# Patient Record
Sex: Female | Born: 1975 | Race: Black or African American | Hispanic: No | Marital: Single | Smoking: Never smoker
Health system: Southern US, Community
[De-identification: ages and names within clinical notes are randomized; demographics above are authoritative.]

## PROBLEM LIST (undated history)

## (undated) DIAGNOSIS — Z21 Asymptomatic human immunodeficiency virus [HIV] infection status: Secondary | ICD-10-CM

## (undated) DIAGNOSIS — D649 Anemia, unspecified: Secondary | ICD-10-CM

## (undated) DIAGNOSIS — F419 Anxiety disorder, unspecified: Secondary | ICD-10-CM

## (undated) DIAGNOSIS — B20 Human immunodeficiency virus [HIV] disease: Secondary | ICD-10-CM

## (undated) DIAGNOSIS — C541 Malignant neoplasm of endometrium: Secondary | ICD-10-CM

## (undated) HISTORY — DX: Asymptomatic human immunodeficiency virus (hiv) infection status: Z21

## (undated) HISTORY — DX: Human immunodeficiency virus (HIV) disease: B20

---

## 2003-10-11 ENCOUNTER — Emergency Department (HOSPITAL_COMMUNITY): Admission: EM | Admit: 2003-10-11 | Discharge: 2003-10-11 | Payer: Self-pay | Admitting: Emergency Medicine

## 2004-10-19 ENCOUNTER — Emergency Department (HOSPITAL_COMMUNITY): Admission: EM | Admit: 2004-10-19 | Discharge: 2004-10-19 | Payer: Self-pay | Admitting: Emergency Medicine

## 2005-01-10 ENCOUNTER — Ambulatory Visit: Payer: Self-pay | Admitting: Family Medicine

## 2005-01-19 ENCOUNTER — Ambulatory Visit: Payer: Self-pay | Admitting: Sports Medicine

## 2005-03-05 ENCOUNTER — Emergency Department (HOSPITAL_COMMUNITY): Admission: EM | Admit: 2005-03-05 | Discharge: 2005-03-05 | Payer: Self-pay | Admitting: Emergency Medicine

## 2005-03-22 ENCOUNTER — Emergency Department (HOSPITAL_COMMUNITY): Admission: EM | Admit: 2005-03-22 | Discharge: 2005-03-22 | Payer: Self-pay | Admitting: Emergency Medicine

## 2006-09-17 ENCOUNTER — Emergency Department (HOSPITAL_COMMUNITY): Admission: EM | Admit: 2006-09-17 | Discharge: 2006-09-17 | Payer: Self-pay | Admitting: Emergency Medicine

## 2006-11-14 ENCOUNTER — Emergency Department (HOSPITAL_COMMUNITY): Admission: EM | Admit: 2006-11-14 | Discharge: 2006-11-14 | Payer: Self-pay | Admitting: Emergency Medicine

## 2010-10-26 ENCOUNTER — Emergency Department (HOSPITAL_COMMUNITY)
Admission: EM | Admit: 2010-10-26 | Discharge: 2010-10-26 | Payer: Self-pay | Source: Home / Self Care | Admitting: Emergency Medicine

## 2010-11-15 ENCOUNTER — Encounter: Payer: Self-pay | Admitting: Adult Health

## 2010-11-15 ENCOUNTER — Other Ambulatory Visit: Payer: Self-pay | Admitting: Adult Health

## 2010-11-15 ENCOUNTER — Ambulatory Visit: Payer: Self-pay

## 2010-11-15 ENCOUNTER — Other Ambulatory Visit (INDEPENDENT_AMBULATORY_CARE_PROVIDER_SITE_OTHER): Payer: 59

## 2010-11-15 DIAGNOSIS — B20 Human immunodeficiency virus [HIV] disease: Secondary | ICD-10-CM | POA: Insufficient documentation

## 2010-11-15 LAB — CONVERTED CEMR LAB
ALT: 19 units/L (ref 0–35)
AST: 28 units/L (ref 0–37)
Albumin: 3.7 g/dL (ref 3.5–5.2)
Alkaline Phosphatase: 55 units/L (ref 39–117)
BUN: 7 mg/dL (ref 6–23)
Basophils Absolute: 0 10*3/uL (ref 0.0–0.1)
Basophils Relative: 0 % (ref 0–1)
CO2: 25 meq/L (ref 19–32)
Calcium: 8.9 mg/dL (ref 8.4–10.5)
Chloride: 106 meq/L (ref 96–112)
Cholesterol: 141 mg/dL (ref 0–200)
Creatinine, Ser: 0.51 mg/dL (ref 0.40–1.20)
Eosinophils Absolute: 0.5 10*3/uL (ref 0.0–0.7)
Eosinophils Relative: 10 % — ABNORMAL HIGH (ref 0–5)
Glucose, Bld: 84 mg/dL (ref 70–99)
HCT: 33.9 % — ABNORMAL LOW (ref 36.0–46.0)
HCV Ab: NEGATIVE
HDL: 39 mg/dL — ABNORMAL LOW (ref >39)
HIV 1 RNA Quant: 22800 copies/mL — ABNORMAL HIGH (ref <20)
HIV-1 RNA Quant, Log: 4.36 — ABNORMAL HIGH (ref <1.30)
HIV-1 antibody: POSITIVE — AB
HIV-2 Ab: NEGATIVE
HIV: REACTIVE
Hemoglobin: 10.9 g/dL — ABNORMAL LOW (ref 12.0–15.0)
Hep A Total Ab: NEGATIVE
Hep B Core Total Ab: NEGATIVE
Hep B S Ab: NEGATIVE
Hepatitis B Surface Ag: NEGATIVE
LDL Cholesterol: 80 mg/dL (ref 0–99)
Lymphocytes Relative: 31 % (ref 12–46)
Lymphs Abs: 1.4 10*3/uL (ref 0.7–4.0)
MCHC: 32.2 g/dL (ref 30.0–36.0)
MCV: 85.2 fL (ref 78.0–100.0)
Monocytes Absolute: 0.4 10*3/uL (ref 0.1–1.0)
Monocytes Relative: 9 % (ref 3–12)
Neutro Abs: 2.2 10*3/uL (ref 1.7–7.7)
Neutrophils Relative %: 50 % (ref 43–77)
Platelets: 231 10*3/uL (ref 150–400)
Potassium: 4.2 meq/L (ref 3.5–5.3)
RBC: 3.98 M/uL (ref 3.87–5.11)
RDW: 14.2 % (ref 11.5–15.5)
Sodium: 138 meq/L (ref 135–145)
Total Bilirubin: 0.4 mg/dL (ref 0.3–1.2)
Total CHOL/HDL Ratio: 3.6
Total Protein: 8.3 g/dL (ref 6.0–8.3)
Triglycerides: 111 mg/dL (ref <150)
VLDL: 22 mg/dL (ref 0–40)
WBC: 4.5 10*3/uL (ref 4.0–10.5)

## 2010-11-16 LAB — T-HELPER CELL (CD4) - (RCID CLINIC ONLY)
CD4 % Helper T Cell: 4 % — ABNORMAL LOW (ref 33–55)
CD4 T Cell Abs: 50 uL — ABNORMAL LOW (ref 400–2700)

## 2010-11-17 LAB — CONVERTED CEMR LAB
Bilirubin Urine: NEGATIVE
Blood, UA: NEGATIVE
Casts: NONE SEEN /lpf
Chlamydia, Swab/Urine, PCR: NEGATIVE
Crystals: NONE SEEN
GC Probe Amp, Urine: NEGATIVE
Ketones, ur: NEGATIVE mg/dL
Nitrite: POSITIVE — AB
Protein, ur: NEGATIVE mg/dL
Specific Gravity, Urine: 1.024 (ref 1.005–1.030)
Urine Glucose: NEGATIVE mg/dL
Urobilinogen, UA: 0.2 (ref 0.0–1.0)
pH: 6 (ref 5.0–8.0)

## 2010-11-20 ENCOUNTER — Emergency Department (HOSPITAL_COMMUNITY)
Admission: EM | Admit: 2010-11-20 | Discharge: 2010-11-20 | Disposition: A | Discharge status: Home / Self Care | Payer: 59 | Attending: Emergency Medicine | Admitting: Emergency Medicine

## 2010-11-20 DIAGNOSIS — F17200 Nicotine dependence, unspecified, uncomplicated: Secondary | ICD-10-CM | POA: Insufficient documentation

## 2010-11-20 DIAGNOSIS — H11419 Vascular abnormalities of conjunctiva, unspecified eye: Secondary | ICD-10-CM | POA: Insufficient documentation

## 2010-11-20 DIAGNOSIS — H5789 Other specified disorders of eye and adnexa: Secondary | ICD-10-CM | POA: Insufficient documentation

## 2010-11-20 DIAGNOSIS — H15009 Unspecified scleritis, unspecified eye: Secondary | ICD-10-CM | POA: Insufficient documentation

## 2010-11-20 DIAGNOSIS — Z21 Asymptomatic human immunodeficiency virus [HIV] infection status: Secondary | ICD-10-CM | POA: Insufficient documentation

## 2010-11-20 DIAGNOSIS — H53149 Visual discomfort, unspecified: Secondary | ICD-10-CM | POA: Insufficient documentation

## 2010-11-20 DIAGNOSIS — H5710 Ocular pain, unspecified eye: Secondary | ICD-10-CM | POA: Insufficient documentation

## 2010-11-24 NOTE — Miscellaneous (Signed)
Summary: LABS  Lab Visit  Orders Today:

## 2010-11-24 NOTE — Miscellaneous (Signed)
Summary: New 042 Lab orders/tkk  Clinical Lists Changes  Problems: Added new problem of HIV INFECTION (ICD-042) Orders: Added new Test order of T-Chlamydia  Probe, urine [phone]) - Signed Added new Test order of T-CBC w/Diff [phone]) - Signed Added new Test order of T-CD4SP [facility] [place]) (CD4SP) - Signed Added new Test order of T-GC Probe, urine [phone]) - Signed Added new Test order of T-Comprehensive Metabolic Panel [phone]) - Signed Added new Test order of T-Hepatitis B Surface Antigen [phone]) - Signed Added new Test order of T-Hepatitis B Surface Antibody ([phone]) - Signed Added new Test order of T-Hepatitis B Core Antibody ([phone]) - Signed Added new Test order of T-Hepatitis C Antibody ([id number]) - Signed Added new Test order of T-Hepatitis A Antibody ([id number]) - Signed Added new Test order of T-HIV1 Quant rflx Ultra or Genotype ([id number]) - Signed Added new Test order of T-HIV Antibody  (Reflex) ([id number]) - Signed Added new Test order of T-Lipid Profile ([id number]) - Signed Added new Test order of T-RPR (Syphilis) ([id number]) - Signed Added new Test order of T-Urinalysis ([id number]) - Signed

## 2010-11-29 ENCOUNTER — Ambulatory Visit: Payer: 59 | Admitting: Adult Health

## 2010-11-29 ENCOUNTER — Ambulatory Visit: Payer: Self-pay | Admitting: Adult Health

## 2010-12-13 ENCOUNTER — Ambulatory Visit: Payer: 59 | Admitting: Adult Health

## 2011-08-14 ENCOUNTER — Encounter: Payer: Self-pay | Admitting: Internal Medicine

## 2011-08-14 ENCOUNTER — Other Ambulatory Visit: Payer: Self-pay

## 2011-08-14 ENCOUNTER — Other Ambulatory Visit: Payer: Self-pay | Admitting: *Deleted

## 2011-08-14 DIAGNOSIS — Z113 Encounter for screening for infections with a predominantly sexual mode of transmission: Secondary | ICD-10-CM

## 2011-08-14 DIAGNOSIS — Z79899 Other long term (current) drug therapy: Secondary | ICD-10-CM

## 2011-08-14 DIAGNOSIS — B20 Human immunodeficiency virus [HIV] disease: Secondary | ICD-10-CM

## 2011-08-14 MED ORDER — SULFAMETHOXAZOLE-TMP DS 800-160 MG PO TABS: 1.0000 | ORAL_TABLET | Freq: Every day | ORAL | Status: DC

## 2011-08-14 MED ORDER — AZITHROMYCIN 600 MG PO TABS: 1200.0000 mg | ORAL_TABLET | ORAL | Status: DC

## 2011-08-15 ENCOUNTER — Other Ambulatory Visit: Payer: Self-pay | Admitting: Infectious Diseases

## 2011-08-15 ENCOUNTER — Other Ambulatory Visit (INDEPENDENT_AMBULATORY_CARE_PROVIDER_SITE_OTHER): Payer: Self-pay

## 2011-08-15 DIAGNOSIS — B20 Human immunodeficiency virus [HIV] disease: Secondary | ICD-10-CM

## 2011-08-15 DIAGNOSIS — Z113 Encounter for screening for infections with a predominantly sexual mode of transmission: Secondary | ICD-10-CM

## 2011-08-15 DIAGNOSIS — Z79899 Other long term (current) drug therapy: Secondary | ICD-10-CM

## 2011-08-16 LAB — URINALYSIS, MICROSCOPIC ONLY
Bacteria, UA: NONE SEEN
Casts: NONE SEEN
Crystals: NONE SEEN

## 2011-08-16 LAB — CBC WITH DIFFERENTIAL/PLATELET
Basophils Absolute: 0 10*3/uL (ref 0.0–0.1)
Basophils Relative: 1 % (ref 0–1)
Eosinophils Absolute: 0.2 10*3/uL (ref 0.0–0.7)
Eosinophils Relative: 7 % — ABNORMAL HIGH (ref 0–5)
HCT: 35 % — ABNORMAL LOW (ref 36.0–46.0)
Hemoglobin: 11.3 g/dL — ABNORMAL LOW (ref 12.0–15.0)
Lymphocytes Relative: 33 % (ref 12–46)
Lymphs Abs: 1.2 10*3/uL (ref 0.7–4.0)
MCH: 26.9 pg (ref 26.0–34.0)
MCHC: 32.3 g/dL (ref 30.0–36.0)
MCV: 83.3 fL (ref 78.0–100.0)
Monocytes Absolute: 0.4 10*3/uL (ref 0.1–1.0)
Monocytes Relative: 10 % (ref 3–12)
Neutro Abs: 1.9 10*3/uL (ref 1.7–7.7)
Neutrophils Relative %: 51 % (ref 43–77)
Platelets: 209 10*3/uL (ref 150–400)
RBC: 4.2 MIL/uL (ref 3.87–5.11)
RDW: 14.1 % (ref 11.5–15.5)
WBC: 3.7 10*3/uL — ABNORMAL LOW (ref 4.0–10.5)

## 2011-08-16 LAB — LIPID PANEL
Cholesterol: 144 mg/dL (ref 0–200)
HDL: 42 mg/dL (ref >39)
LDL Cholesterol: 88 mg/dL (ref 0–99)
Total CHOL/HDL Ratio: 3.4 Ratio
Triglycerides: 70 mg/dL (ref <150)
VLDL: 14 mg/dL (ref 0–40)

## 2011-08-16 LAB — COMPLETE METABOLIC PANEL WITH GFR
ALT: 28 U/L (ref 0–35)
AST: 34 U/L (ref 0–37)
Albumin: 4.3 g/dL (ref 3.5–5.2)
Alkaline Phosphatase: 60 U/L (ref 39–117)
BUN: 8 mg/dL (ref 6–23)
CO2: 24 mEq/L (ref 19–32)
Calcium: 8.9 mg/dL (ref 8.4–10.5)
Chloride: 104 mEq/L (ref 96–112)
Creat: 0.68 mg/dL (ref 0.50–1.10)
GFR, Est African American: >89 mL/min (ref >89)
GFR, Est Non African American: >89 mL/min (ref >89)
Glucose, Bld: 84 mg/dL (ref 70–99)
Potassium: 3.8 mEq/L (ref 3.5–5.3)
Sodium: 138 mEq/L (ref 135–145)
Total Bilirubin: 0.4 mg/dL (ref 0.3–1.2)
Total Protein: 8.7 g/dL — ABNORMAL HIGH (ref 6.0–8.3)

## 2011-08-16 LAB — URINALYSIS, ROUTINE W REFLEX MICROSCOPIC
Bilirubin Urine: NEGATIVE
Glucose, UA: NEGATIVE mg/dL
Ketones, ur: NEGATIVE mg/dL
Nitrite: NEGATIVE
Protein, ur: NEGATIVE mg/dL
Specific Gravity, Urine: 1.026 (ref 1.005–1.030)
Urobilinogen, UA: 1 mg/dL (ref 0.0–1.0)
pH: 6 (ref 5.0–8.0)

## 2011-08-16 LAB — GC/CHLAMYDIA PROBE AMP, URINE
Chlamydia, Swab/Urine, PCR: NEGATIVE
GC Probe Amp, Urine: NEGATIVE

## 2011-08-16 LAB — T-HELPER CELL (CD4) - (RCID CLINIC ONLY)
CD4 % Helper T Cell: 2 % — ABNORMAL LOW (ref 33–55)
CD4 T Cell Abs: 30 uL — ABNORMAL LOW (ref 400–2700)

## 2011-08-16 LAB — RPR

## 2011-08-17 LAB — HIV-1 RNA QUANT-NO REFLEX-BLD
HIV 1 RNA Quant: 40400 copies/mL — ABNORMAL HIGH (ref <20)
HIV-1 RNA Quant, Log: 4.61 {Log} — ABNORMAL HIGH (ref <1.30)

## 2011-08-29 ENCOUNTER — Ambulatory Visit: Payer: 59 | Admitting: Internal Medicine

## 2011-09-07 ENCOUNTER — Encounter: Payer: Self-pay | Admitting: *Deleted

## 2011-10-25 ENCOUNTER — Telehealth: Payer: Self-pay | Admitting: *Deleted

## 2011-10-25 NOTE — Telephone Encounter (Signed)
Called patient and scheduled appointment with Dr. Luciana Axe for 11/09/11.  She had labs done 08/15/11.  Speaking with [place], she does not seem to understand the importance of seeing a physician on a regular basis. Wendall Mola CMA

## 2011-11-09 ENCOUNTER — Ambulatory Visit (INDEPENDENT_AMBULATORY_CARE_PROVIDER_SITE_OTHER): Payer: Self-pay | Admitting: Internal Medicine

## 2011-11-09 ENCOUNTER — Encounter: Payer: Self-pay | Admitting: Internal Medicine

## 2011-11-09 VITALS — BP 126/82 | HR 89 | Temp 97.8°F | Wt 192.0 lb

## 2011-11-09 DIAGNOSIS — B20 Human immunodeficiency virus [HIV] disease: Secondary | ICD-10-CM

## 2011-11-09 DIAGNOSIS — Z23 Encounter for immunization: Secondary | ICD-10-CM

## 2011-11-09 DIAGNOSIS — N912 Amenorrhea, unspecified: Secondary | ICD-10-CM

## 2011-11-09 MED ORDER — RITONAVIR 100 MG PO TABS: 100.0000 mg | ORAL_TABLET | Freq: Every day | ORAL | Status: DC

## 2011-11-09 MED ORDER — EMTRICITABINE-TENOFOVIR DF 200-300 MG PO TABS: 1.0000 | ORAL_TABLET | Freq: Every day | ORAL | Status: DC

## 2011-11-09 MED ORDER — DARUNAVIR ETHANOLATE 800 MG PO TABS: 1.0000 | ORAL_TABLET | Freq: Every day | ORAL | Status: DC

## 2011-11-09 NOTE — Patient Instructions (Addendum)
Follow up with [facility] taking your medicine every day once you receive it  Check your blood test 3 weeks after start the medicine

## 2011-11-10 ENCOUNTER — Encounter: Payer: Self-pay | Admitting: Internal Medicine

## 2011-11-10 NOTE — Assessment & Plan Note (Signed)
After discussing treatment options, she will be started on boost it Prezista with Truvada. This will be initiated once she is eligible for ADAP. I discussed the side effects of the medications at length with her and answered all questions. I also did emphasize that she needs to continue with prophylaxis for PCP and MAC. She voiced her understanding. Once she fills her prescriptions, she will take the medication and was instructed to return for a lab test 3 weeks following that and she will be seen 1-2 weeks after the blood tests. The patient did voice her understanding of the need for excellent compliance. She denies being currently sexually active, however she was reminded to use condoms with all sexual activity.

## 2011-11-10 NOTE — Progress Notes (Signed)
  Subjective:    Patient ID: Kristy White, female    DOB: 27-Jun-1976, 36 y.o.   MRN: [id number]  HPI This patient comes in as a new patient for her HIV. The patient has known about her status for many years however has avoided treatment. She comes in today now interested in treatment. She is on weekly azithromycin as well as daily Bactrim prophylaxis. Her CD4 count was 30 and viral load elevated. She has had no recent complaints. She does express to me that she does not like to talk about her diagnosis and avoids thinking about it when possible however she does feel that she is ready and able to take medications. She does not like to talk about how she got it or any other aspects of HIV.   Review of Systems  Constitutional: Negative for fever, chills, appetite change, fatigue and unexpected weight change.  HENT: Negative for sore throat, mouth sores and trouble swallowing.   Respiratory: Negative for cough, shortness of breath and wheezing.   Cardiovascular: Negative for chest pain, palpitations and leg swelling.  Gastrointestinal: Negative for nausea, vomiting, abdominal pain and diarrhea.  Genitourinary: Positive for menstrual problem. Negative for dysuria, frequency, hematuria and genital sores.  Musculoskeletal: Negative for myalgias and arthralgias.  Skin: Negative for pallor and rash.  Neurological: Negative for dizziness, syncope, light-headedness, numbness and headaches.  Hematological: Negative for adenopathy.  Psychiatric/Behavioral: Negative for suicidal ideas, sleep disturbance and dysphoric mood. The patient is nervous/anxious.        Objective:   Physical Exam  Constitutional: She is oriented to person, place, and time. She appears well-developed and well-nourished. No distress.  HENT:  Mouth/Throat: Oropharynx is clear and moist. No oropharyngeal exudate.  Cardiovascular: Normal rate, regular rhythm and normal heart sounds.  Exam reveals no gallop and no friction rub.     No murmur heard. Pulmonary/Chest: Effort normal. No respiratory distress. She has no wheezes. She has no rales.  Abdominal: Soft. Bowel sounds are normal. She exhibits no distension. There is no tenderness. There is no rebound.  Lymphadenopathy:    She has no cervical adenopathy.  Neurological: She is alert and oriented to person, place, and time.  Skin: Skin is warm and dry. No rash noted. No erythema.  Psychiatric:       anxious          Assessment & Plan:

## 2011-11-21 ENCOUNTER — Ambulatory Visit: Payer: Self-pay

## 2011-11-21 ENCOUNTER — Telehealth: Payer: Self-pay | Admitting: *Deleted

## 2011-11-21 NOTE — Telephone Encounter (Signed)
She came in asking if she should get her HIV meds filled. She is waiting for ADAP. Has no insurance or money to get them filled. I told her as soon as we get notification that she has been approved, I will send them to [facility] in [place], [place]. They will notify her & ship them to her home. She is taking the bactrim & azithromycin.

## 2011-12-05 ENCOUNTER — Other Ambulatory Visit: Payer: Self-pay | Admitting: *Deleted

## 2011-12-05 DIAGNOSIS — B20 Human immunodeficiency virus [HIV] disease: Secondary | ICD-10-CM

## 2011-12-05 MED ORDER — RITONAVIR 100 MG PO TABS: 100.0000 mg | ORAL_TABLET | Freq: Every day | ORAL | Status: DC

## 2011-12-05 MED ORDER — AZITHROMYCIN 600 MG PO TABS: 1200.0000 mg | ORAL_TABLET | ORAL | Status: DC

## 2011-12-05 MED ORDER — DARUNAVIR ETHANOLATE 800 MG PO TABS: 800.0000 mg | ORAL_TABLET | Freq: Every day | ORAL | Status: DC

## 2011-12-05 MED ORDER — SULFAMETHOXAZOLE-TMP DS 800-160 MG PO TABS: 1.0000 | ORAL_TABLET | Freq: Every day | ORAL | Status: DC

## 2011-12-05 MED ORDER — EMTRICITABINE-TENOFOVIR DF 200-300 MG PO TABS: 1.0000 | ORAL_TABLET | Freq: Every day | ORAL | Status: DC

## 2011-12-07 ENCOUNTER — Other Ambulatory Visit (INDEPENDENT_AMBULATORY_CARE_PROVIDER_SITE_OTHER): Payer: Self-pay

## 2011-12-07 DIAGNOSIS — B20 Human immunodeficiency virus [HIV] disease: Secondary | ICD-10-CM

## 2011-12-07 LAB — HEPATITIS B SURFACE ANTIBODY,QUALITATIVE: Hep B S Ab: NEGATIVE

## 2011-12-07 LAB — CBC WITH DIFFERENTIAL/PLATELET
Basophils Absolute: 0 10*3/uL (ref 0.0–0.1)
Basophils Relative: 0 % (ref 0–1)
Eosinophils Absolute: 0.2 10*3/uL (ref 0.0–0.7)
Eosinophils Relative: 6 % — ABNORMAL HIGH (ref 0–5)
HCT: 30.5 % — ABNORMAL LOW (ref 36.0–46.0)
Hemoglobin: 10.1 g/dL — ABNORMAL LOW (ref 12.0–15.0)
Lymphocytes Relative: 31 % (ref 12–46)
Lymphs Abs: 1.1 10*3/uL (ref 0.7–4.0)
MCH: 27.9 pg (ref 26.0–34.0)
MCHC: 33.1 g/dL (ref 30.0–36.0)
MCV: 84.3 fL (ref 78.0–100.0)
Monocytes Absolute: 0.5 10*3/uL (ref 0.1–1.0)
Monocytes Relative: 14 % — ABNORMAL HIGH (ref 3–12)
Neutro Abs: 1.8 10*3/uL (ref 1.7–7.7)
Neutrophils Relative %: 49 % (ref 43–77)
Platelets: 206 10*3/uL (ref 150–400)
RBC: 3.62 MIL/uL — ABNORMAL LOW (ref 3.87–5.11)
RDW: 14 % (ref 11.5–15.5)
WBC: 3.7 10*3/uL — ABNORMAL LOW (ref 4.0–10.5)

## 2011-12-07 LAB — COMPREHENSIVE METABOLIC PANEL
ALT: 33 U/L (ref 0–35)
AST: 36 U/L (ref 0–37)
Albumin: 3.9 g/dL (ref 3.5–5.2)
Alkaline Phosphatase: 59 U/L (ref 39–117)
BUN: 7 mg/dL (ref 6–23)
CO2: 23 mEq/L (ref 19–32)
Calcium: 8.9 mg/dL (ref 8.4–10.5)
Chloride: 107 mEq/L (ref 96–112)
Creat: 0.59 mg/dL (ref 0.50–1.10)
Glucose, Bld: 76 mg/dL (ref 70–99)
Potassium: 3.8 mEq/L (ref 3.5–5.3)
Sodium: 137 mEq/L (ref 135–145)
Total Bilirubin: 0.5 mg/dL (ref 0.3–1.2)
Total Protein: 7.7 g/dL (ref 6.0–8.3)

## 2011-12-07 LAB — HEPATITIS B SURFACE ANTIGEN: Hepatitis B Surface Ag: NEGATIVE

## 2011-12-07 LAB — HEPATITIS C ANTIBODY: HCV Ab: NEGATIVE

## 2011-12-08 LAB — HEPATITIS B CORE ANTIBODY, TOTAL: Hep B Core Total Ab: NEGATIVE

## 2011-12-08 LAB — T-HELPER CELL (CD4) - (RCID CLINIC ONLY)
CD4 % Helper T Cell: 3 % — ABNORMAL LOW (ref 33–55)
CD4 T Cell Abs: 90 uL — ABNORMAL LOW (ref 400–2700)

## 2011-12-08 LAB — HEPATITIS A ANTIBODY, TOTAL: Hep A Total Ab: NEGATIVE

## 2011-12-11 ENCOUNTER — Other Ambulatory Visit: Payer: Self-pay | Admitting: *Deleted

## 2011-12-11 DIAGNOSIS — B20 Human immunodeficiency virus [HIV] disease: Secondary | ICD-10-CM

## 2011-12-11 LAB — HIV-1 RNA QUANT-NO REFLEX-BLD
HIV 1 RNA Quant: 41890 copies/mL — ABNORMAL HIGH (ref <20)
HIV-1 RNA Quant, Log: 4.62 {Log} — ABNORMAL HIGH (ref <1.30)

## 2011-12-11 MED ORDER — AZITHROMYCIN 600 MG PO TABS: 1200.0000 mg | ORAL_TABLET | ORAL | Status: DC

## 2011-12-11 MED ORDER — RITONAVIR 100 MG PO TABS: 100.0000 mg | ORAL_TABLET | Freq: Every day | ORAL | Status: DC

## 2011-12-11 MED ORDER — EMTRICITABINE-TENOFOVIR DF 200-300 MG PO TABS: 1.0000 | ORAL_TABLET | Freq: Every day | ORAL | Status: DC

## 2011-12-11 MED ORDER — SULFAMETHOXAZOLE-TMP DS 800-160 MG PO TABS: 1.0000 | ORAL_TABLET | Freq: Every day | ORAL | Status: DC

## 2011-12-11 MED ORDER — DARUNAVIR ETHANOLATE 800 MG PO TABS: 800.0000 mg | ORAL_TABLET | Freq: Every day | ORAL | Status: DC

## 2011-12-21 ENCOUNTER — Ambulatory Visit: Payer: Self-pay | Admitting: Internal Medicine

## 2011-12-21 ENCOUNTER — Telehealth: Payer: Self-pay | Admitting: *Deleted

## 2011-12-21 NOTE — Telephone Encounter (Signed)
I left a message for her to please call back & make another appt

## 2012-01-23 ENCOUNTER — Telehealth: Payer: Self-pay | Admitting: *Deleted

## 2012-01-23 ENCOUNTER — Ambulatory Visit: Payer: Self-pay | Admitting: Internal Medicine

## 2012-01-23 NOTE — Telephone Encounter (Signed)
Referral made to [facility] for multiple no shows. Sanyla Summey CMA  

## 2012-03-06 ENCOUNTER — Ambulatory Visit: Payer: Self-pay | Admitting: *Deleted

## 2012-03-06 ENCOUNTER — Telehealth: Payer: Self-pay | Admitting: *Deleted

## 2012-03-06 ENCOUNTER — Other Ambulatory Visit (INDEPENDENT_AMBULATORY_CARE_PROVIDER_SITE_OTHER): Payer: Self-pay

## 2012-03-06 ENCOUNTER — Ambulatory Visit (INDEPENDENT_AMBULATORY_CARE_PROVIDER_SITE_OTHER): Payer: Self-pay | Admitting: Internal Medicine

## 2012-03-06 ENCOUNTER — Ambulatory Visit: Payer: Self-pay | Admitting: Internal Medicine

## 2012-03-06 DIAGNOSIS — B20 Human immunodeficiency virus [HIV] disease: Secondary | ICD-10-CM

## 2012-03-06 DIAGNOSIS — R87619 Unspecified abnormal cytological findings in specimens from cervix uteri: Secondary | ICD-10-CM

## 2012-03-06 DIAGNOSIS — Z124 Encounter for screening for malignant neoplasm of cervix: Secondary | ICD-10-CM

## 2012-03-06 HISTORY — DX: Unspecified abnormal cytological findings in specimens from cervix uteri: R87.619

## 2012-03-06 NOTE — Progress Notes (Signed)
  Subjective:    Patient ID: Kristy White, female    DOB: [birth year], 36 y.o.   MRN: [id number]  HPI Patient arrived late.  Will get labs today.    Review of Systems     Objective:   Physical Exam        Assessment & Plan:

## 2012-03-06 NOTE — Progress Notes (Signed)
  Subjective:     Kristy White is a 36 y.o. woman who comes in today for a  pap smear only. Contraception: Condoms  Objective:    There were no vitals taken for this visit.  LMP 02/26/12 Pelvic Exam:  Pap smear obtained.   Assessment:    Screening pap smear.   Plan:    Follow up in one year, or as indicated by Pap results.  Pt given condoms.  Pt given educational materials re:  HIV and women, diet, exercise, nutrition, PAP smears, BSE, heart disease and HIV, and self-esteem.

## 2012-03-06 NOTE — Telephone Encounter (Signed)
Patient no showed todays appointment.  She was referred to [facility] in April for multiple no shows, will check with [facility] to see where they are with this referral. Wendall Mola CMA

## 2012-03-07 LAB — COMPLETE METABOLIC PANEL WITH GFR
ALT: 15 U/L (ref 0–35)
AST: 21 U/L (ref 0–37)
Albumin: 4 g/dL (ref 3.5–5.2)
Alkaline Phosphatase: 63 U/L (ref 39–117)
BUN: 10 mg/dL (ref 6–23)
CO2: 26 mEq/L (ref 19–32)
Calcium: 9 mg/dL (ref 8.4–10.5)
Chloride: 105 mEq/L (ref 96–112)
Creat: 0.8 mg/dL (ref 0.50–1.10)
GFR, Est African American: >89 mL/min
GFR, Est Non African American: >89 mL/min
Glucose, Bld: 92 mg/dL (ref 70–99)
Potassium: 4.3 mEq/L (ref 3.5–5.3)
Sodium: 137 mEq/L (ref 135–145)
Total Bilirubin: 0.2 mg/dL — ABNORMAL LOW (ref 0.3–1.2)
Total Protein: 8.6 g/dL — ABNORMAL HIGH (ref 6.0–8.3)

## 2012-03-07 LAB — CBC WITH DIFFERENTIAL/PLATELET
Basophils Absolute: 0 10*3/uL (ref 0.0–0.1)
Basophils Relative: 1 % (ref 0–1)
Eosinophils Absolute: 0.2 10*3/uL (ref 0.0–0.7)
Eosinophils Relative: 3 % (ref 0–5)
HCT: 32 % — ABNORMAL LOW (ref 36.0–46.0)
Hemoglobin: 10.8 g/dL — ABNORMAL LOW (ref 12.0–15.0)
Lymphocytes Relative: 45 % (ref 12–46)
Lymphs Abs: 2.4 10*3/uL (ref 0.7–4.0)
MCH: 29 pg (ref 26.0–34.0)
MCHC: 33.8 g/dL (ref 30.0–36.0)
MCV: 86 fL (ref 78.0–100.0)
Monocytes Absolute: 0.5 10*3/uL (ref 0.1–1.0)
Monocytes Relative: 9 % (ref 3–12)
Neutro Abs: 2.2 10*3/uL (ref 1.7–7.7)
Neutrophils Relative %: 42 % — ABNORMAL LOW (ref 43–77)
Platelets: 290 10*3/uL (ref 150–400)
RBC: 3.72 MIL/uL — ABNORMAL LOW (ref 3.87–5.11)
RDW: 14.8 % (ref 11.5–15.5)
WBC: 5.3 10*3/uL (ref 4.0–10.5)

## 2012-03-08 LAB — T-HELPER CELL (CD4) - (RCID CLINIC ONLY)
CD4 % Helper T Cell: 5 % — ABNORMAL LOW (ref 33–55)
CD4 T Cell Abs: 120 uL — ABNORMAL LOW (ref 400–2700)

## 2012-03-08 LAB — HIV-1 RNA QUANT-NO REFLEX-BLD
HIV 1 RNA Quant: 31 copies/mL — ABNORMAL HIGH (ref <20)
HIV-1 RNA Quant, Log: 1.49 {Log} — ABNORMAL HIGH (ref <1.30)

## 2012-03-19 ENCOUNTER — Telehealth: Payer: Self-pay | Admitting: *Deleted

## 2012-03-19 DIAGNOSIS — IMO0002 Reserved for concepts with insufficient information to code with codable children: Secondary | ICD-10-CM

## 2012-03-19 NOTE — Telephone Encounter (Signed)
Pt returned call re:  Referral to [facility] for abnormal PAP smear results.  RN advised that the pt will be contacted by either [facility] or Elisa E. From [facility] about the appt at [facility].  Pt concerned about the results.  RN advised pt that the GYN Clinic at [facility] will provide treatment for the abnormal results.  Also, that abnormal PAP smear results as the most treatable type of abnormal cells.  Pt verbalized understanding and will definitely be keeping this appt.

## 2012-03-19 NOTE — Telephone Encounter (Signed)
Message left.  Pt being referred to [facility] for LSIL PAP smear results.  Also, requested that pt call [facility] to reschedule appt w/ Dr. Luciana Axe.

## 2012-04-09 ENCOUNTER — Encounter: Payer: Self-pay | Admitting: Internal Medicine

## 2012-04-09 ENCOUNTER — Ambulatory Visit (INDEPENDENT_AMBULATORY_CARE_PROVIDER_SITE_OTHER): Payer: Self-pay

## 2012-04-09 VITALS — BP 132/84 | HR 98 | Temp 98.0°F | Ht 66.0 in | Wt 199.0 lb

## 2012-04-09 DIAGNOSIS — B20 Human immunodeficiency virus [HIV] disease: Secondary | ICD-10-CM

## 2012-04-09 NOTE — Progress Notes (Signed)
  Subjective:    Patient ID: Kristy White, female    DOB: [birth year], 36 y.o.   MRN: [id number]  HPI She comes in for followup of her HIV. She was seen in January after a long absence and never having been on treatment which she avoided mainly due to the stigma. She then though was ready for treatment and started on a regimen of boosted Prezista and Truvada. She reports good compliance though does endorse about 2 minutes the doses over the last month. She does understand the importance of compliance. She also continues to take the Bactrim and azithromycin prophylaxis. She otherwise has no complaints in regards to medication and is tolerating it well. She has had some recent problems with athlete's foot but that has resolved. She is interested in establishing a primary care physician.   Review of Systems  Constitutional: Negative for fever, appetite change, fatigue and unexpected weight change.  HENT: Negative for sore throat and trouble swallowing.   Respiratory: Negative for cough and shortness of breath.   Cardiovascular: Negative for chest pain, palpitations and leg swelling.  Gastrointestinal: Negative for nausea, abdominal pain and diarrhea.  Musculoskeletal: Negative for myalgias, joint swelling and arthralgias.  Skin: Negative for rash.  Neurological: Negative for dizziness, weakness and headaches.  Hematological: Negative for adenopathy.  Psychiatric/Behavioral: Negative for dysphoric mood. The patient is not nervous/anxious.        Objective:   Physical Exam  Constitutional: She appears well-developed and well-nourished. No distress.  HENT:  Mouth/Throat: Oropharynx is clear and moist. No oropharyngeal exudate.  Cardiovascular: Normal rate, regular rhythm and normal heart sounds.  Exam reveals no gallop and no friction rub.   No murmur heard. Pulmonary/Chest: Effort normal and breath sounds normal. No respiratory distress. She has no wheezes. She has no rales.  Abdominal:  Soft. Bowel sounds are normal. She exhibits no distension. There is no tenderness. There is no rebound.  Lymphadenopathy:    She has no cervical adenopathy.          Assessment & Plan:

## 2012-04-09 NOTE — Assessment & Plan Note (Signed)
She is doing well and appears to be accepting of her diagnosis. She understands the importance of taking the medication everyday and understands importance of condom use. She will return in 3 months for labs and a followup appointment 2 weeks after. She knows to call sooner if she has any problems. She is going to establish with a primary care physician.

## 2012-04-15 NOTE — Progress Notes (Signed)
This encounter was created in error - please disregard.

## 2012-04-18 ENCOUNTER — Encounter: Payer: Self-pay | Admitting: Physician Assistant

## 2012-05-14 ENCOUNTER — Ambulatory Visit: Payer: Self-pay

## 2012-05-27 ENCOUNTER — Telehealth: Payer: Self-pay | Admitting: *Deleted

## 2012-05-27 ENCOUNTER — Encounter: Payer: Self-pay | Admitting: Obstetrics & Gynecology

## 2012-05-27 NOTE — Telephone Encounter (Signed)
Patient was given appt at [facility], but after multiple voice mails were left with her and she did not return my call the appt was cancelled. Kristy White

## 2012-06-03 ENCOUNTER — Encounter: Payer: Self-pay | Admitting: Internal Medicine

## 2012-07-01 ENCOUNTER — Ambulatory Visit: Payer: Self-pay

## 2012-07-04 ENCOUNTER — Other Ambulatory Visit: Payer: Self-pay

## 2012-07-18 ENCOUNTER — Ambulatory Visit: Payer: Self-pay | Admitting: Internal Medicine

## 2012-09-19 ENCOUNTER — Other Ambulatory Visit: Payer: Self-pay | Admitting: *Deleted

## 2012-09-19 ENCOUNTER — Ambulatory Visit: Payer: Self-pay

## 2012-09-19 ENCOUNTER — Other Ambulatory Visit (INDEPENDENT_AMBULATORY_CARE_PROVIDER_SITE_OTHER): Payer: Self-pay

## 2012-09-19 DIAGNOSIS — B20 Human immunodeficiency virus [HIV] disease: Secondary | ICD-10-CM

## 2012-09-19 LAB — CBC WITH DIFFERENTIAL/PLATELET
Basophils Absolute: 0 10*3/uL (ref 0.0–0.1)
Basophils Relative: 0 % (ref 0–1)
Eosinophils Absolute: 0 10*3/uL (ref 0.0–0.7)
Eosinophils Relative: 1 % (ref 0–5)
HCT: 33.8 % — ABNORMAL LOW (ref 36.0–46.0)
Hemoglobin: 11.3 g/dL — ABNORMAL LOW (ref 12.0–15.0)
Lymphocytes Relative: 32 % (ref 12–46)
Lymphs Abs: 1.2 10*3/uL (ref 0.7–4.0)
MCH: 28.5 pg (ref 26.0–34.0)
MCHC: 33.4 g/dL (ref 30.0–36.0)
MCV: 85.4 fL (ref 78.0–100.0)
Monocytes Absolute: 0.5 10*3/uL (ref 0.1–1.0)
Monocytes Relative: 12 % (ref 3–12)
Neutro Abs: 2.1 10*3/uL (ref 1.7–7.7)
Neutrophils Relative %: 55 % (ref 43–77)
Platelets: 248 10*3/uL (ref 150–400)
RBC: 3.96 MIL/uL (ref 3.87–5.11)
RDW: 13.9 % (ref 11.5–15.5)
WBC: 3.9 10*3/uL — ABNORMAL LOW (ref 4.0–10.5)

## 2012-09-19 MED ORDER — DARUNAVIR ETHANOLATE 800 MG PO TABS: 800.0000 mg | ORAL_TABLET | Freq: Every day | ORAL | Status: DC

## 2012-09-19 MED ORDER — EMTRICITABINE-TENOFOVIR DF 200-300 MG PO TABS: 1.0000 | ORAL_TABLET | Freq: Every day | ORAL | Status: DC

## 2012-09-19 MED ORDER — RITONAVIR 100 MG PO TABS: 100.0000 mg | ORAL_TABLET | Freq: Every day | ORAL | Status: DC

## 2012-09-20 LAB — COMPLETE METABOLIC PANEL WITH GFR
ALT: 16 U/L (ref 0–35)
AST: 20 U/L (ref 0–37)
Albumin: 3.9 g/dL (ref 3.5–5.2)
Alkaline Phosphatase: 69 U/L (ref 39–117)
BUN: 7 mg/dL (ref 6–23)
CO2: 28 mEq/L (ref 19–32)
Calcium: 9.2 mg/dL (ref 8.4–10.5)
Chloride: 107 mEq/L (ref 96–112)
Creat: 0.57 mg/dL (ref 0.50–1.10)
GFR, Est African American: >89 mL/min
GFR, Est Non African American: >89 mL/min
Glucose, Bld: 85 mg/dL (ref 70–99)
Potassium: 3.9 mEq/L (ref 3.5–5.3)
Sodium: 139 mEq/L (ref 135–145)
Total Bilirubin: 0.2 mg/dL — ABNORMAL LOW (ref 0.3–1.2)
Total Protein: 8 g/dL (ref 6.0–8.3)

## 2012-09-20 LAB — HIV-1 RNA QUANT-NO REFLEX-BLD
HIV 1 RNA Quant: 38208 copies/mL — ABNORMAL HIGH (ref <20)
HIV-1 RNA Quant, Log: 4.58 {Log} — ABNORMAL HIGH (ref <1.30)

## 2012-09-20 LAB — T-HELPER CELL (CD4) - (RCID CLINIC ONLY)
CD4 % Helper T Cell: 9 % — ABNORMAL LOW (ref 33–55)
CD4 T Cell Abs: 110 uL — ABNORMAL LOW (ref 400–2700)

## 2012-09-25 ENCOUNTER — Ambulatory Visit: Payer: Self-pay | Admitting: Internal Medicine

## 2012-09-30 ENCOUNTER — Telehealth: Payer: Self-pay

## 2012-09-30 NOTE — Telephone Encounter (Signed)
Patient was approved for ADAP until 01/06/13 - ADAP has brithdate wrong - should be [birth year], not Feb 26, 1976 - tried to contact patient at both numbers in system - left vm on home phone to call me to let her know - faxed to Nobie Putnam at ADAP copy of id with correct birthdate - he will update in system and for [facility] as well.

## 2012-10-10 ENCOUNTER — Ambulatory Visit: Payer: Self-pay | Admitting: Internal Medicine

## 2012-10-10 ENCOUNTER — Telehealth: Payer: Self-pay | Admitting: *Deleted

## 2012-10-10 NOTE — Telephone Encounter (Signed)
Called patient to reschedule appt, she no showed today, no working phone numbers.  She did have labs 09/26/12. Wendall Mola

## 2012-12-26 ENCOUNTER — Telehealth: Payer: Self-pay | Admitting: *Deleted

## 2012-12-26 ENCOUNTER — Encounter: Payer: Self-pay | Admitting: *Deleted

## 2012-12-26 DIAGNOSIS — B20 Human immunodeficiency virus [HIV] disease: Secondary | ICD-10-CM

## 2012-12-26 MED ORDER — SULFAMETHOXAZOLE-TMP DS 800-160 MG PO TABS: 1.0000 | ORAL_TABLET | Freq: Every day | ORAL | Status: DC

## 2012-12-26 MED ORDER — EMTRICITABINE-TENOFOVIR DF 200-300 MG PO TABS: 1.0000 | ORAL_TABLET | Freq: Every day | ORAL | Status: DC

## 2012-12-26 MED ORDER — DARUNAVIR ETHANOLATE 800 MG PO TABS: 800.0000 mg | ORAL_TABLET | Freq: Every day | ORAL | Status: DC

## 2012-12-26 MED ORDER — AZITHROMYCIN 600 MG PO TABS: 1200.0000 mg | ORAL_TABLET | ORAL | Status: DC

## 2012-12-26 MED ORDER — RITONAVIR 100 MG PO TABS: 100.0000 mg | ORAL_TABLET | Freq: Every day | ORAL | Status: DC

## 2012-12-26 NOTE — Telephone Encounter (Signed)
Sent pt letter requesting she make MD, Lab and ADAP renewal appt ASAP.

## 2013-01-02 ENCOUNTER — Other Ambulatory Visit: Payer: Self-pay | Admitting: *Deleted

## 2013-01-02 DIAGNOSIS — B20 Human immunodeficiency virus [HIV] disease: Secondary | ICD-10-CM

## 2013-01-02 DIAGNOSIS — Z79899 Other long term (current) drug therapy: Secondary | ICD-10-CM

## 2013-01-02 DIAGNOSIS — Z113 Encounter for screening for infections with a predominantly sexual mode of transmission: Secondary | ICD-10-CM

## 2013-01-06 ENCOUNTER — Other Ambulatory Visit (INDEPENDENT_AMBULATORY_CARE_PROVIDER_SITE_OTHER): Payer: Self-pay

## 2013-01-06 ENCOUNTER — Ambulatory Visit: Payer: Self-pay

## 2013-01-06 ENCOUNTER — Other Ambulatory Visit (HOSPITAL_COMMUNITY): Admission: RE | Admit: 2013-01-06 | Payer: Self-pay | Source: Ambulatory Visit | Admitting: Infectious Disease

## 2013-01-06 DIAGNOSIS — Z113 Encounter for screening for infections with a predominantly sexual mode of transmission: Secondary | ICD-10-CM

## 2013-01-06 DIAGNOSIS — Z79899 Other long term (current) drug therapy: Secondary | ICD-10-CM

## 2013-01-06 DIAGNOSIS — B20 Human immunodeficiency virus [HIV] disease: Secondary | ICD-10-CM

## 2013-01-06 LAB — COMPLETE METABOLIC PANEL WITH GFR
ALT: 13 U/L (ref 0–35)
AST: 15 U/L (ref 0–37)
Albumin: 3.6 g/dL (ref 3.5–5.2)
Alkaline Phosphatase: 74 U/L (ref 39–117)
BUN: 9 mg/dL (ref 6–23)
CO2: 28 mEq/L (ref 19–32)
Calcium: 8.9 mg/dL (ref 8.4–10.5)
Chloride: 104 mEq/L (ref 96–112)
Creat: 0.48 mg/dL — ABNORMAL LOW (ref 0.50–1.10)
GFR, Est African American: >89 mL/min
GFR, Est Non African American: >89 mL/min
Glucose, Bld: 71 mg/dL (ref 70–99)
Potassium: 3.9 mEq/L (ref 3.5–5.3)
Sodium: 137 mEq/L (ref 135–145)
Total Bilirubin: 0.3 mg/dL (ref 0.3–1.2)
Total Protein: 7.7 g/dL (ref 6.0–8.3)

## 2013-01-06 LAB — LIPID PANEL
Cholesterol: 147 mg/dL (ref 0–200)
HDL: 38 mg/dL — ABNORMAL LOW (ref >39)
LDL Cholesterol: 87 mg/dL (ref 0–99)
Total CHOL/HDL Ratio: 3.9 Ratio
Triglycerides: 108 mg/dL (ref <150)
VLDL: 22 mg/dL (ref 0–40)

## 2013-01-06 LAB — CBC WITH DIFFERENTIAL/PLATELET
Basophils Absolute: 0 10*3/uL (ref 0.0–0.1)
Basophils Relative: 1 % (ref 0–1)
Eosinophils Absolute: 0.1 10*3/uL (ref 0.0–0.7)
Eosinophils Relative: 2 % (ref 0–5)
HCT: 33.3 % — ABNORMAL LOW (ref 36.0–46.0)
Hemoglobin: 10.9 g/dL — ABNORMAL LOW (ref 12.0–15.0)
Lymphocytes Relative: 41 % (ref 12–46)
Lymphs Abs: 2.6 10*3/uL (ref 0.7–4.0)
MCH: 28.2 pg (ref 26.0–34.0)
MCHC: 32.7 g/dL (ref 30.0–36.0)
MCV: 86.3 fL (ref 78.0–100.0)
Monocytes Absolute: 0.5 10*3/uL (ref 0.1–1.0)
Monocytes Relative: 8 % (ref 3–12)
Neutro Abs: 3.2 10*3/uL (ref 1.7–7.7)
Neutrophils Relative %: 48 % (ref 43–77)
Platelets: 326 10*3/uL (ref 150–400)
RBC: 3.86 MIL/uL — ABNORMAL LOW (ref 3.87–5.11)
RDW: 14.4 % (ref 11.5–15.5)
WBC: 6.5 10*3/uL (ref 4.0–10.5)

## 2013-01-07 LAB — T-HELPER CELL (CD4) - (RCID CLINIC ONLY)
CD4 % Helper T Cell: 8 % — ABNORMAL LOW (ref 33–55)
CD4 T Cell Abs: 190 uL — ABNORMAL LOW (ref 400–2700)

## 2013-01-07 LAB — RPR

## 2013-01-08 LAB — HIV-1 RNA QUANT-NO REFLEX-BLD
HIV 1 RNA Quant: <20 copies/mL (ref <20)
HIV-1 RNA Quant, Log: <1.3 {Log} (ref <1.30)

## 2013-01-20 ENCOUNTER — Ambulatory Visit (INDEPENDENT_AMBULATORY_CARE_PROVIDER_SITE_OTHER): Payer: Self-pay | Admitting: Internal Medicine

## 2013-01-20 ENCOUNTER — Encounter: Payer: Self-pay | Admitting: Internal Medicine

## 2013-01-20 VITALS — BP 127/85 | HR 84 | Temp 98.4°F | Ht 68.0 in | Wt 216.0 lb

## 2013-01-20 DIAGNOSIS — R6889 Other general symptoms and signs: Secondary | ICD-10-CM

## 2013-01-20 DIAGNOSIS — IMO0002 Reserved for concepts with insufficient information to code with codable children: Secondary | ICD-10-CM

## 2013-01-20 DIAGNOSIS — B20 Human immunodeficiency virus [HIV] disease: Secondary | ICD-10-CM

## 2013-01-20 MED ORDER — DARUNAVIR ETHANOLATE 800 MG PO TABS: 800.0000 mg | ORAL_TABLET | Freq: Every day | ORAL | Status: DC

## 2013-01-20 MED ORDER — RITONAVIR 100 MG PO TABS: 100.0000 mg | ORAL_TABLET | Freq: Every day | ORAL | Status: DC

## 2013-01-20 MED ORDER — SULFAMETHOXAZOLE-TMP DS 800-160 MG PO TABS: 1.0000 | ORAL_TABLET | Freq: Every day | ORAL | Status: DC

## 2013-01-20 MED ORDER — EMTRICITABINE-TENOFOVIR DF 200-300 MG PO TABS: 1.0000 | ORAL_TABLET | Freq: Every day | ORAL | Status: DC

## 2013-01-20 NOTE — Assessment & Plan Note (Signed)
She will be referred back to [facility] again the

## 2013-01-20 NOTE — Assessment & Plan Note (Addendum)
She is doing well now with her regimen now that she is started. I'm going to have her return in 2 months.  She does not need DAs azithromycin at this time but will continue Bactrim prophylaxis.  She was counseled on the need for compliance and good followup to maintain her health.

## 2013-01-20 NOTE — Progress Notes (Signed)
  Subjective:    Patient ID: Kristy White, female    DOB: 09/12/1976, 37 y.o.   MRN: [id number]  HPI She comes in for followup of her HIV. She has had sporadic followup and I last saw her in April of 2013 and she had labs done after that but no showed the appointment, but she comes in here now having filled her prescriptions recently for her antiretrovirals which include Prezista, Norvir and Truvada, and she actually has an undetectable viral load with good CD4 count. She feels well on the medication with no side effects and is pleased to be undetectable. She has no other new issues.    He does have a history of an abnormal Pap smear and did have an appointment at [facility] clinic however did not make the appointment, this was last year.   Review of Systems  Constitutional: Negative for fever, fatigue and unexpected weight change.  HENT: Negative for sore throat and trouble swallowing.   Respiratory: Negative for cough and shortness of breath.   Cardiovascular: Negative for leg swelling.  Gastrointestinal: Negative for nausea, abdominal pain and diarrhea.  Musculoskeletal: Negative for myalgias, joint swelling and arthralgias.  Skin: Negative for rash.  Neurological: Negative for dizziness, light-headedness and headaches.       Objective:   Physical Exam  Constitutional: She appears well-developed and well-nourished. No distress.  HENT:  Mouth/Throat: Oropharynx is clear and moist. No oropharyngeal exudate.  Cardiovascular: Normal rate, regular rhythm and normal heart sounds.  Exam reveals no gallop and no friction rub.   No murmur heard. Pulmonary/Chest: Effort normal and breath sounds normal. No respiratory distress. She has no wheezes. She has no rales.  Abdominal: Soft. Bowel sounds are normal. She exhibits no distension. There is no tenderness. There is no rebound.  Skin: No rash noted.          Assessment & Plan:

## 2013-01-27 ENCOUNTER — Ambulatory Visit: Payer: Self-pay | Admitting: Internal Medicine

## 2013-03-17 ENCOUNTER — Other Ambulatory Visit: Payer: Self-pay

## 2013-03-31 ENCOUNTER — Ambulatory Visit (INDEPENDENT_AMBULATORY_CARE_PROVIDER_SITE_OTHER): Payer: Self-pay | Admitting: Internal Medicine

## 2013-03-31 ENCOUNTER — Encounter: Payer: Self-pay | Admitting: Internal Medicine

## 2013-03-31 VITALS — BP 127/81 | HR 86 | Temp 98.3°F | Ht 66.0 in | Wt 218.0 lb

## 2013-03-31 DIAGNOSIS — B20 Human immunodeficiency virus [HIV] disease: Secondary | ICD-10-CM

## 2013-03-31 NOTE — Assessment & Plan Note (Signed)
I did discuss resistance with air and hopefully she is doing well. I will check her labs today. She will come back in 2 months if they are reassuring otherwise I will call her back sooner. She has missed some of her doses of Bactrim and I did discuss with her opportunistic infections.

## 2013-03-31 NOTE — Progress Notes (Signed)
  Subjective:    Patient ID: Kristy White, female    DOB: 01/11/1976, 37 y.o.   MRN: [id number]  HPI She comes in for a followup. After a sporadic followup and poor compliance in the past, she has been started on Prezista, Norvir and Truvada.  Her CD4 count and viral load were responding well with a CD4 of 190 and suppression of her viral load. She does continue to take the medicine though does endorse several missed doses since her last visit. She feels well though but does have multiple questions regarding resistance. No weight loss, no diarrhea or rashes.   Review of Systems  Constitutional: Negative for fever and fatigue.  HENT: Negative for sore throat and trouble swallowing.   Respiratory: Negative for shortness of breath.   Gastrointestinal: Negative for nausea and diarrhea.  Musculoskeletal: Negative for myalgias and arthralgias.  Skin: Negative for rash.  Neurological: Negative for dizziness, light-headedness and headaches.  Hematological: Negative for adenopathy.  Psychiatric/Behavioral: Negative for dysphoric mood.       Objective:   Physical Exam  Constitutional: She appears well-developed and well-nourished. No distress.  HENT:  Mouth/Throat: No oropharyngeal exudate.  Eyes: No scleral icterus.  Cardiovascular: Normal rate, regular rhythm and normal heart sounds.   No murmur heard. Pulmonary/Chest: Effort normal and breath sounds normal. No respiratory distress. She has no wheezes.  Lymphadenopathy:    She has no cervical adenopathy.          Assessment & Plan:

## 2013-04-02 LAB — T-HELPER CELL (CD4) - (RCID CLINIC ONLY)
CD4 % Helper T Cell: 10 % — ABNORMAL LOW (ref 33–55)
CD4 T Cell Abs: 220 uL — ABNORMAL LOW (ref 400–2700)

## 2013-04-02 LAB — HIV-1 RNA QUANT-NO REFLEX-BLD
HIV 1 RNA Quant: <20 copies/mL (ref <20)
HIV-1 RNA Quant, Log: <1.3 {Log} (ref <1.30)

## 2013-04-15 ENCOUNTER — Other Ambulatory Visit: Payer: Self-pay | Admitting: Internal Medicine

## 2013-04-15 DIAGNOSIS — IMO0002 Reserved for concepts with insufficient information to code with codable children: Secondary | ICD-10-CM

## 2013-04-15 DIAGNOSIS — R87612 Low grade squamous intraepithelial lesion on cytologic smear of cervix (LGSIL): Secondary | ICD-10-CM

## 2013-05-16 ENCOUNTER — Encounter: Payer: Self-pay | Admitting: Obstetrics & Gynecology

## 2013-05-16 ENCOUNTER — Other Ambulatory Visit (HOSPITAL_COMMUNITY)
Admission: RE | Admit: 2013-05-16 | Discharge: 2013-05-16 | Disposition: A | Discharge status: Home / Self Care | Payer: Medicaid Other | Source: Ambulatory Visit | Attending: Obstetrics & Gynecology | Admitting: Obstetrics & Gynecology

## 2013-05-16 ENCOUNTER — Ambulatory Visit (INDEPENDENT_AMBULATORY_CARE_PROVIDER_SITE_OTHER): Payer: Medicaid Other | Admitting: Obstetrics & Gynecology

## 2013-05-16 VITALS — BP 116/78 | HR 72 | Temp 97.1°F | Ht 66.0 in | Wt 216.0 lb

## 2013-05-16 DIAGNOSIS — R6889 Other general symptoms and signs: Secondary | ICD-10-CM

## 2013-05-16 DIAGNOSIS — IMO0002 Reserved for concepts with insufficient information to code with codable children: Secondary | ICD-10-CM

## 2013-05-16 DIAGNOSIS — R87612 Low grade squamous intraepithelial lesion on cytologic smear of cervix (LGSIL): Secondary | ICD-10-CM

## 2013-05-16 DIAGNOSIS — Z1151 Encounter for screening for human papillomavirus (HPV): Secondary | ICD-10-CM | POA: Insufficient documentation

## 2013-05-16 DIAGNOSIS — Z01419 Encounter for gynecological examination (general) (routine) without abnormal findings: Secondary | ICD-10-CM | POA: Insufficient documentation

## 2013-05-16 NOTE — Progress Notes (Signed)
Patient ID: Kristy White, female   DOB: [birth year], 37 y.o.   MRN: [id number]  Chief Complaint  Patient presents with  . Abnormal Pap Smear    repeat pap    HPI Kristy White is a 37 y.o. female.  [id number] Patient's last menstrual period was 03/30/2013. No t currently sexually active, had pap 03/06/12 LSIL, missed appointment for colposcopy.  HPI  Past Medical History  Diagnosis Date  . HIV infection     History reviewed. No pertinent past surgical history.  Family History  Problem Relation Age of Onset  . Mental illness Mother     Social History History  Substance Use Topics  . Smoking status: Never Smoker   . Smokeless tobacco: Never Used  . Alcohol Use: No    No Known Allergies  Current Outpatient Prescriptions  Medication Sig Dispense Refill  . Darunavir Ethanolate (PREZISTA) 800 MG tablet Take 1 tablet (800 mg total) by mouth daily. Close azithromycin  30 tablet  5  . emtricitabine-tenofovir (TRUVADA) 200-300 MG per tablet Take 1 tablet by mouth daily.  30 tablet  5  . ritonavir (NORVIR) 100 MG TABS Take 1 tablet (100 mg total) by mouth daily. With Prezista.  30 tablet  5  . sulfamethoxazole-trimethoprim (BACTRIM DS) 800-160 MG per tablet Take 1 tablet by mouth daily.  30 tablet  5   No current facility-administered medications for this visit.    Review of Systems Review of Systems  Genitourinary: Positive for menstrual problem (irregular). Negative for vaginal bleeding, vaginal discharge and pelvic pain.    Blood pressure 116/78, pulse 72, temperature 97.1 F (36.2 C), temperature source Oral, height 5\' 6"  (1.676 m), weight 216 lb (97.977 kg), last menstrual period 03/30/2013.  Physical Exam Physical Exam  Constitutional: She appears well-developed. No distress.  Pulmonary/Chest: Effort normal. No respiratory distress.  Genitourinary: Vagina normal. No vaginal discharge found.  Pap done, cervix nulliparous  Skin: Skin is warm and dry.  Psychiatric:  She has a normal mood and affect. Her behavior is normal.    Data Reviewed Pap result  Assessment    H/O LSIL pap 15 months ago     Plan    Pap repeated + HPV cotesting, if normal then repeat 1 year        Kristy White 05/16/2013, 8:44 AM

## 2013-05-16 NOTE — Patient Instructions (Signed)
Abnormal Pap Test Information During a Pap test, the cells on the surface of your cervix are checked to see if they look normal, abnormal, or if they show signs of having been altered by a certain type of virus called human papillomavirus, or HPV. Cervical cells that have been affected by HPV are called dysplasia. Dysplasia is not cancer, but describes abnormal cells found on the surface of the cervix. Depending on the degree of dysplasia, some of the cells may be considered pre-cancerous and may turn into cancer over time if follow up with a caregiver is delayed.  WHAT DOES AN ABNORMAL PAP TEST MEAN? Having an abnormal pap test does not mean that you have cancer. However, certain types of abnormal pap tests can be a sign that a person is at a higher risk of developing cancer. Your caregiver will want to do other tests to find out more about the abnormal cells. Your abnormal Pap test results could show:   Small and uncertain changes that should be carefully watched.   Cervical dysplasia that has caused mild changes and can be followed over time.  Cervical dysplasia that is more severe and needs to be followed and treated to ensure the problem goes away.  Cancer.  When severe cervical dysplasia is found and treated early, it rarely will grow into cancer.  WHAT WILL BE DONE ABOUT MY ABNORMAL PAP TEST?  A colposcopy may be needed. This is a procedure where your cervix is examined using light and magnification.  A small tissue sample of your cervix (biopsy) may need to be removed and then examined. This is often performed if there are areas that appear infected.  A sample of cells from the cervical canal may be removed with either a small brush or scraping instrument (curette). Based on the results of the procedures above, some caregivers may recommend either cryotherapy of the cervix or a surgical LEEP where a portion of the cervix is removed. LEEP is short for "loop electrical excisional  procedure." Rarely, a caregiver may recommend a cone biopsy.This is a procedure where a small, cone-shaped sample of your cervix is taken out. The part that is taken out is the area where the abnormal cells are.  WHAT IF I HAVE A DYSPLASIA OR A CANCER? You may be referred to a specialist. Radiation may also be a treatment for more advanced cancer. Having a hysterectomy is the last treatment option for dysplasia, but it is a more common treatment for someone with cancer. All treatment options will be discussed with you by your caregiver. WHAT SHOULD YOU DO AFTER BEING TREATED? If you have had an abnormal pap test, you should continue to have regular pap tests and check-ups as directed by your caregiver. Your cervical problem will be carefully watched so it does not get worse. Also, your caregiver can watch for, and treat, any new problems that may come up. Document Released: 01/10/2011 Document Revised: 12/18/2011 Document Reviewed: 09/21/2011 Exit[employer] Patient Information 2014 [employer].  

## 2013-05-29 ENCOUNTER — Other Ambulatory Visit: Payer: Self-pay

## 2013-06-12 ENCOUNTER — Telehealth: Payer: Self-pay | Admitting: *Deleted

## 2013-06-12 ENCOUNTER — Ambulatory Visit: Payer: Self-pay | Admitting: Internal Medicine

## 2013-06-12 NOTE — Telephone Encounter (Signed)
Called patient and left her a brief voice mail explaining our no show policy. Advised she can call the clinic and speak to anyone hear who would be happy to explain further the policy. Wendall Mola

## 2013-06-24 ENCOUNTER — Telehealth: Payer: Self-pay | Admitting: *Deleted

## 2013-06-24 NOTE — Telephone Encounter (Signed)
Returned patient's call requesting information about our walk-in policy.  Gave her information including days, times, and that while she may not be able to see a MD, we would try to at least get labs and a nurse visit. Andree Coss, RN

## 2014-01-24 ENCOUNTER — Other Ambulatory Visit: Payer: Self-pay | Admitting: Internal Medicine

## 2014-01-24 DIAGNOSIS — B20 Human immunodeficiency virus [HIV] disease: Secondary | ICD-10-CM

## 2014-01-26 ENCOUNTER — Encounter: Payer: Self-pay | Admitting: Internal Medicine

## 2014-01-26 ENCOUNTER — Other Ambulatory Visit (HOSPITAL_COMMUNITY)
Admission: RE | Admit: 2014-01-26 | Discharge: 2014-01-26 | Disposition: A | Discharge status: Home / Self Care | Payer: Medicaid Other | Source: Ambulatory Visit | Attending: Internal Medicine | Admitting: Internal Medicine

## 2014-01-26 ENCOUNTER — Telehealth: Payer: Self-pay | Admitting: *Deleted

## 2014-01-26 ENCOUNTER — Ambulatory Visit (INDEPENDENT_AMBULATORY_CARE_PROVIDER_SITE_OTHER): Payer: Medicaid Other | Admitting: Internal Medicine

## 2014-01-26 VITALS — BP 121/78 | HR 86 | Temp 97.4°F | Wt 224.0 lb

## 2014-01-26 DIAGNOSIS — R635 Abnormal weight gain: Secondary | ICD-10-CM | POA: Insufficient documentation

## 2014-01-26 DIAGNOSIS — N926 Irregular menstruation, unspecified: Secondary | ICD-10-CM

## 2014-01-26 DIAGNOSIS — Z113 Encounter for screening for infections with a predominantly sexual mode of transmission: Secondary | ICD-10-CM

## 2014-01-26 DIAGNOSIS — Z79899 Other long term (current) drug therapy: Secondary | ICD-10-CM

## 2014-01-26 DIAGNOSIS — N939 Abnormal uterine and vaginal bleeding, unspecified: Secondary | ICD-10-CM | POA: Insufficient documentation

## 2014-01-26 DIAGNOSIS — B20 Human immunodeficiency virus [HIV] disease: Secondary | ICD-10-CM

## 2014-01-26 LAB — COMPLETE METABOLIC PANEL WITH GFR
ALT: 15 U/L (ref 0–35)
AST: 18 U/L (ref 0–37)
Albumin: 3.9 g/dL (ref 3.5–5.2)
Alkaline Phosphatase: 77 U/L (ref 39–117)
BUN: 7 mg/dL (ref 6–23)
CO2: 24 mEq/L (ref 19–32)
Calcium: 8.9 mg/dL (ref 8.4–10.5)
Chloride: 105 mEq/L (ref 96–112)
Creat: 0.56 mg/dL (ref 0.50–1.10)
GFR, Est African American: >89 mL/min
GFR, Est Non African American: >89 mL/min
Glucose, Bld: 96 mg/dL (ref 70–99)
Potassium: 4.1 mEq/L (ref 3.5–5.3)
Sodium: 139 mEq/L (ref 135–145)
Total Bilirubin: 0.3 mg/dL (ref 0.2–1.2)
Total Protein: 7.5 g/dL (ref 6.0–8.3)

## 2014-01-26 LAB — CBC WITH DIFFERENTIAL/PLATELET
Basophils Absolute: 0 10*3/uL (ref 0.0–0.1)
Basophils Relative: 0 % (ref 0–1)
Eosinophils Absolute: 0.1 10*3/uL (ref 0.0–0.7)
Eosinophils Relative: 1 % (ref 0–5)
HCT: 31.9 % — ABNORMAL LOW (ref 36.0–46.0)
Hemoglobin: 10.9 g/dL — ABNORMAL LOW (ref 12.0–15.0)
Lymphocytes Relative: 30 % (ref 12–46)
Lymphs Abs: 2 10*3/uL (ref 0.7–4.0)
MCH: 28.8 pg (ref 26.0–34.0)
MCHC: 34.2 g/dL (ref 30.0–36.0)
MCV: 84.2 fL (ref 78.0–100.0)
Monocytes Absolute: 0.7 10*3/uL (ref 0.1–1.0)
Monocytes Relative: 10 % (ref 3–12)
Neutro Abs: 3.9 10*3/uL (ref 1.7–7.7)
Neutrophils Relative %: 59 % (ref 43–77)
Platelets: 344 10*3/uL (ref 150–400)
RBC: 3.79 MIL/uL — ABNORMAL LOW (ref 3.87–5.11)
RDW: 14.6 % (ref 11.5–15.5)
WBC: 6.6 10*3/uL (ref 4.0–10.5)

## 2014-01-26 LAB — LIPID PANEL
Cholesterol: 172 mg/dL (ref 0–200)
HDL: 45 mg/dL (ref >39)
LDL Cholesterol: 104 mg/dL — ABNORMAL HIGH (ref 0–99)
Total CHOL/HDL Ratio: 3.8 Ratio
Triglycerides: 115 mg/dL (ref <150)
VLDL: 23 mg/dL (ref 0–40)

## 2014-01-26 LAB — POCT URINE PREGNANCY: Preg Test, Ur: NEGATIVE

## 2014-01-26 NOTE — Assessment & Plan Note (Addendum)
Will check labs today.  If ok, rtc 3 months.  Will stop bactrim if CD4 > 200.

## 2014-01-26 NOTE — Addendum Note (Signed)
Addended by: Lurlean LeydenPOOLE, TRAVIS F on: 01/26/2014 03:34 PM   Modules accepted: Orders

## 2014-01-26 NOTE — Telephone Encounter (Signed)
Received refill request, filled for 30 days, no refills.  Left message notifying patient that she must be seen for additional refills.  Pt was supposed to follow up 05/2013, has not been seen since 03/2013.  Per pharmacy, patient has picked up medications for the last 3 months, but has not had a prescription written for 1 year.  Pharmacy will inform patient as well. Patient needs walk-in appointment due to her no-show rate.

## 2014-01-26 NOTE — Assessment & Plan Note (Signed)
6 lbs higher than last year.  I discussed exercise and heathy eating.  Will monitor.

## 2014-01-26 NOTE — Assessment & Plan Note (Signed)
Will check pregnancy and refer to gyn if negative.  No signs of early menopause.  No associated symptoms and does not use birth control.

## 2014-01-26 NOTE — Progress Notes (Signed)
   Subjective:    Patient ID: Kristy White, female    DOB: [birth year], 38 y.o.   MRN: [id number]  HPI Here for a walk in visit.  Last seen June 2014 and had just started Prezista/r with Truvada.  Also on Bactrim with CD4 of 190.  No showed multiple times and not in again until today.  Tells me she has been taking meds daily with no missed doses.  Also with only 2 small menstruatl periods since last visit.  Not sexually active for 5 years.  Concerned about weight gain.  Concerned about side effects with tenofovir.     Review of Systems  Constitutional: Negative for fatigue and unexpected weight change.  Gastrointestinal: Negative for nausea and diarrhea.  Skin: Negative for rash.  Neurological: Negative for dizziness and light-headedness.       Objective:   Physical Exam  Constitutional: She appears well-developed and well-nourished. No distress.  HENT:  Mouth/Throat: No oropharyngeal exudate.  Eyes: Right eye exhibits no discharge. Left eye exhibits no discharge. No scleral icterus.  Cardiovascular: Normal rate, regular rhythm and normal heart sounds.   No murmur heard. Pulmonary/Chest: Effort normal and breath sounds normal. No respiratory distress.  Lymphadenopathy:    She has no cervical adenopathy.  Skin: No rash noted.          Assessment & Plan:

## 2014-01-27 LAB — URINE CYTOLOGY ANCILLARY ONLY
Chlamydia: NEGATIVE
Neisseria Gonorrhea: NEGATIVE

## 2014-01-27 LAB — HIV-1 RNA QUANT-NO REFLEX-BLD
HIV 1 RNA Quant: 35 copies/mL — ABNORMAL HIGH (ref <20)
HIV-1 RNA Quant, Log: 1.54 {Log} — ABNORMAL HIGH (ref <1.30)

## 2014-01-27 LAB — RPR

## 2014-01-30 LAB — HLA B*5701: HLA-B*5701 w/rflx HLA-B High: NEGATIVE

## 2014-02-28 ENCOUNTER — Other Ambulatory Visit: Payer: Self-pay | Admitting: Internal Medicine

## 2014-03-03 ENCOUNTER — Other Ambulatory Visit: Payer: Self-pay | Admitting: *Deleted

## 2014-03-03 DIAGNOSIS — B20 Human immunodeficiency virus [HIV] disease: Secondary | ICD-10-CM

## 2014-03-03 MED ORDER — SULFAMETHOXAZOLE-TMP DS 800-160 MG PO TABS: 1.0000 | ORAL_TABLET | Freq: Every day | ORAL | Status: DC

## 2014-03-03 MED ORDER — EMTRICITABINE-TENOFOVIR DF 200-300 MG PO TABS: ORAL_TABLET | ORAL | Status: DC

## 2014-03-03 MED ORDER — RITONAVIR 100 MG PO TABS: ORAL_TABLET | ORAL | Status: DC

## 2014-03-03 MED ORDER — DARUNAVIR ETHANOLATE 800 MG PO TABS: ORAL_TABLET | ORAL | Status: DC

## 2014-05-14 ENCOUNTER — Ambulatory Visit: Payer: Medicaid Other | Admitting: Internal Medicine

## 2014-06-03 ENCOUNTER — Telehealth: Payer: Self-pay | Admitting: *Deleted

## 2014-06-03 NOTE — Telephone Encounter (Signed)
Patient called asking if she could have an MMR blood test drawn for admission at [facility].  Pt states she needs this tomorrow or they will drop her classes. RN advised patient to contact an urgent care or the health department. RN also noted that the patient no-showed on 8/6, reminded her that she needed to follow up with Dr. Linus Salmons.  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 Linus Salmons. Pt agreed.  RN advised her that it was important to keep these appointments. Landis Gandy, RN

## 2014-06-10 ENCOUNTER — Other Ambulatory Visit: Payer: Medicaid Other

## 2014-06-10 DIAGNOSIS — B20 Human immunodeficiency virus [HIV] disease: Secondary | ICD-10-CM

## 2014-06-10 LAB — COMPLETE METABOLIC PANEL WITH GFR
ALT: 17 U/L (ref 0–35)
AST: 20 U/L (ref 0–37)
Albumin: 3.7 g/dL (ref 3.5–5.2)
Alkaline Phosphatase: 85 U/L (ref 39–117)
BUN: 6 mg/dL (ref 6–23)
CO2: 21 mEq/L (ref 19–32)
Calcium: 8.9 mg/dL (ref 8.4–10.5)
Chloride: 103 mEq/L (ref 96–112)
Creat: 0.62 mg/dL (ref 0.50–1.10)
GFR, Est African American: >89 mL/min
GFR, Est Non African American: >89 mL/min
Glucose, Bld: 81 mg/dL (ref 70–99)
Potassium: 4.3 mEq/L (ref 3.5–5.3)
Sodium: 135 mEq/L (ref 135–145)
Total Bilirubin: 0.4 mg/dL (ref 0.2–1.2)
Total Protein: 7.7 g/dL (ref 6.0–8.3)

## 2014-06-10 LAB — CBC WITH DIFFERENTIAL/PLATELET
Basophils Absolute: 0 10*3/uL (ref 0.0–0.1)
Basophils Relative: 0 % (ref 0–1)
Eosinophils Absolute: 0.1 10*3/uL (ref 0.0–0.7)
Eosinophils Relative: 1 % (ref 0–5)
HCT: 33 % — ABNORMAL LOW (ref 36.0–46.0)
Hemoglobin: 10.8 g/dL — ABNORMAL LOW (ref 12.0–15.0)
Lymphocytes Relative: 26 % (ref 12–46)
Lymphs Abs: 1.8 10*3/uL (ref 0.7–4.0)
MCH: 28 pg (ref 26.0–34.0)
MCHC: 32.7 g/dL (ref 30.0–36.0)
MCV: 85.5 fL (ref 78.0–100.0)
Monocytes Absolute: 0.6 10*3/uL (ref 0.1–1.0)
Monocytes Relative: 8 % (ref 3–12)
Neutro Abs: 4.5 10*3/uL (ref 1.7–7.7)
Neutrophils Relative %: 65 % (ref 43–77)
Platelets: 315 10*3/uL (ref 150–400)
RBC: 3.86 MIL/uL — ABNORMAL LOW (ref 3.87–5.11)
RDW: 14.8 % (ref 11.5–15.5)
WBC: 6.9 10*3/uL (ref 4.0–10.5)

## 2014-06-11 LAB — T-HELPER CELL (CD4) - (RCID CLINIC ONLY)
CD4 % Helper T Cell: 14 % — ABNORMAL LOW (ref 33–55)
CD4 T Cell Abs: 270 /uL — ABNORMAL LOW (ref 400–2700)

## 2014-06-12 LAB — HIV-1 RNA QUANT-NO REFLEX-BLD
HIV 1 RNA Quant: <20 copies/mL (ref <20)
HIV-1 RNA Quant, Log: <1.3 {Log} (ref <1.30)

## 2014-06-24 ENCOUNTER — Ambulatory Visit: Payer: Medicaid Other | Admitting: Internal Medicine

## 2014-06-24 ENCOUNTER — Telehealth: Payer: Self-pay | Admitting: *Deleted

## 2014-06-24 NOTE — Telephone Encounter (Signed)
Called and left patient a voice mail to call the clinic to reschedule her appointment. She no showed today Kristy White

## 2014-07-18 ENCOUNTER — Other Ambulatory Visit: Payer: Self-pay | Admitting: Internal Medicine

## 2014-07-18 DIAGNOSIS — B20 Human immunodeficiency virus [HIV] disease: Secondary | ICD-10-CM

## 2014-07-27 ENCOUNTER — Ambulatory Visit: Payer: Medicaid Other | Admitting: Internal Medicine

## 2014-08-10 ENCOUNTER — Encounter: Payer: Self-pay | Admitting: Internal Medicine

## 2014-08-18 ENCOUNTER — Ambulatory Visit (INDEPENDENT_AMBULATORY_CARE_PROVIDER_SITE_OTHER): Payer: Medicaid Other | Admitting: Internal Medicine

## 2014-08-18 ENCOUNTER — Encounter: Payer: Self-pay | Admitting: Internal Medicine

## 2014-08-18 VITALS — BP 123/81 | HR 78 | Temp 97.9°F | Ht 67.0 in | Wt 219.0 lb

## 2014-08-18 DIAGNOSIS — B20 Human immunodeficiency virus [HIV] disease: Secondary | ICD-10-CM

## 2014-08-18 DIAGNOSIS — Z113 Encounter for screening for infections with a predominantly sexual mode of transmission: Secondary | ICD-10-CM | POA: Insufficient documentation

## 2014-08-18 DIAGNOSIS — Z79899 Other long term (current) drug therapy: Secondary | ICD-10-CM | POA: Insufficient documentation

## 2014-08-18 NOTE — Assessment & Plan Note (Signed)
Doing well.  Nervous about HIV but pleased with results.  RTC 4 months.

## 2014-08-18 NOTE — Progress Notes (Signed)
   Subjective:    Patient ID: Kristy White, female    DOB: [birth year], [age] y.o.   MRN: [id number]  HPI Here for follow up of HIV.  Restarted on Prezista/r with Truvada in mid 2014 and has been on it since.  Now back on it for over a year and CD4 up to 270.    Tells me she has been taking meds daily with no missed doses.  No weight loss, no diarrhea.     Review of Systems  Constitutional: Negative for fatigue and unexpected weight change.  Gastrointestinal: Negative for nausea and diarrhea.  Skin: Negative for rash.  Neurological: Negative for dizziness and light-headedness.       Objective:   Physical Exam  Constitutional: She appears well-developed and well-nourished. No distress.  HENT:  Mouth/Throat: No oropharyngeal exudate.  Eyes: No scleral icterus.  Cardiovascular: Normal rate, regular rhythm and normal heart sounds.   No murmur heard. Pulmonary/Chest: Effort normal and breath sounds normal. No respiratory distress.  Lymphadenopathy:    She has no cervical adenopathy.  Skin: No rash noted.          Assessment & Plan:

## 2014-08-25 ENCOUNTER — Other Ambulatory Visit: Payer: Self-pay | Admitting: Internal Medicine

## 2014-08-27 ENCOUNTER — Telehealth: Payer: Self-pay | Admitting: *Deleted

## 2014-08-27 NOTE — Telephone Encounter (Signed)
Patient called asking if it was ok for her to take a dietary supplement that can be purchased online. It is called Iaso Tea and contains several herbs, chamomile, ginger, persimmon leaves, etc. Advised patient that herbal supplements are not FDA approved and she should probably just take a multi vitamin. But she said this is suppose to flush your body of toxins and help in weight loss. Note sent to MD

## 2014-08-28 NOTE — Telephone Encounter (Signed)
Unknown interaction so not advised.  If he does take it, it should at least be separated by 8 hours.  thanks

## 2014-08-28 NOTE — Telephone Encounter (Signed)
Patient notified

## 2014-09-26 ENCOUNTER — Other Ambulatory Visit: Payer: Self-pay | Admitting: Internal Medicine

## 2014-09-26 DIAGNOSIS — B20 Human immunodeficiency virus [HIV] disease: Secondary | ICD-10-CM

## 2014-12-21 ENCOUNTER — Ambulatory Visit: Payer: Medicaid Other | Admitting: Internal Medicine

## 2015-04-18 ENCOUNTER — Other Ambulatory Visit: Payer: Self-pay | Admitting: Internal Medicine

## 2015-04-25 ENCOUNTER — Other Ambulatory Visit: Payer: Self-pay | Admitting: Internal Medicine

## 2015-04-25 DIAGNOSIS — B20 Human immunodeficiency virus [HIV] disease: Secondary | ICD-10-CM

## 2015-06-01 ENCOUNTER — Other Ambulatory Visit: Payer: Medicaid Other

## 2015-06-15 ENCOUNTER — Ambulatory Visit: Payer: Medicaid Other | Admitting: Internal Medicine

## 2015-06-22 ENCOUNTER — Other Ambulatory Visit: Payer: Medicaid Other

## 2015-06-22 DIAGNOSIS — Z113 Encounter for screening for infections with a predominantly sexual mode of transmission: Secondary | ICD-10-CM

## 2015-06-22 DIAGNOSIS — Z79899 Other long term (current) drug therapy: Secondary | ICD-10-CM

## 2015-06-22 DIAGNOSIS — B20 Human immunodeficiency virus [HIV] disease: Secondary | ICD-10-CM

## 2015-06-22 LAB — LIPID PANEL
Cholesterol: 148 mg/dL (ref 125–200)
HDL: 43 mg/dL — ABNORMAL LOW (ref >=46)
LDL Cholesterol: 83 mg/dL (ref <130)
Total CHOL/HDL Ratio: 3.4 Ratio (ref <=5.0)
Triglycerides: 109 mg/dL (ref <150)
VLDL: 22 mg/dL (ref <30)

## 2015-06-22 LAB — CBC WITH DIFFERENTIAL/PLATELET
Basophils Absolute: 0 10*3/uL (ref 0.0–0.1)
Basophils Relative: 0 % (ref 0–1)
Eosinophils Absolute: 0.1 10*3/uL (ref 0.0–0.7)
Eosinophils Relative: 2 % (ref 0–5)
HCT: 34 % — ABNORMAL LOW (ref 36.0–46.0)
Hemoglobin: 11.1 g/dL — ABNORMAL LOW (ref 12.0–15.0)
Lymphocytes Relative: 28 % (ref 12–46)
Lymphs Abs: 2 10*3/uL (ref 0.7–4.0)
MCH: 28.2 pg (ref 26.0–34.0)
MCHC: 32.6 g/dL (ref 30.0–36.0)
MCV: 86.5 fL (ref 78.0–100.0)
MPV: 10 fL (ref 8.6–12.4)
Monocytes Absolute: 0.4 10*3/uL (ref 0.1–1.0)
Monocytes Relative: 6 % (ref 3–12)
Neutro Abs: 4.7 10*3/uL (ref 1.7–7.7)
Neutrophils Relative %: 64 % (ref 43–77)
Platelets: 328 10*3/uL (ref 150–400)
RBC: 3.93 MIL/uL (ref 3.87–5.11)
RDW: 14.4 % (ref 11.5–15.5)
WBC: 7.3 10*3/uL (ref 4.0–10.5)

## 2015-06-22 LAB — COMPLETE METABOLIC PANEL WITH GFR
ALT: 13 U/L (ref 6–29)
AST: 15 U/L (ref 10–30)
Albumin: 3.8 g/dL (ref 3.6–5.1)
Alkaline Phosphatase: 84 U/L (ref 33–115)
BUN: 9 mg/dL (ref 7–25)
CO2: 22 mmol/L (ref 20–31)
Calcium: 8.7 mg/dL (ref 8.6–10.2)
Chloride: 104 mmol/L (ref 98–110)
Creat: 0.63 mg/dL (ref 0.50–1.10)
GFR, Est African American: >89 mL/min (ref >=60)
GFR, Est Non African American: >89 mL/min (ref >=60)
Glucose, Bld: 121 mg/dL — ABNORMAL HIGH (ref 65–99)
Potassium: 4 mmol/L (ref 3.5–5.3)
Sodium: 136 mmol/L (ref 135–146)
Total Bilirubin: 0.4 mg/dL (ref 0.2–1.2)
Total Protein: 7.5 g/dL (ref 6.1–8.1)

## 2015-06-23 LAB — HIV-1 RNA QUANT-NO REFLEX-BLD
HIV 1 RNA Quant: <20 copies/mL (ref <20)
HIV-1 RNA Quant, Log: <1.3 {Log} (ref <1.30)

## 2015-06-23 LAB — T-HELPER CELL (CD4) - (RCID CLINIC ONLY)
CD4 % Helper T Cell: 15 % — ABNORMAL LOW (ref 33–55)
CD4 T Cell Abs: 340 /uL — ABNORMAL LOW (ref 400–2700)

## 2015-06-23 LAB — RPR

## 2015-06-26 ENCOUNTER — Other Ambulatory Visit: Payer: Self-pay | Admitting: Internal Medicine

## 2015-06-26 DIAGNOSIS — B20 Human immunodeficiency virus [HIV] disease: Secondary | ICD-10-CM

## 2015-06-29 LAB — HLA B*5701: HLA-B*5701 w/rflx HLA-B High: NEGATIVE

## 2015-08-12 ENCOUNTER — Ambulatory Visit (INDEPENDENT_AMBULATORY_CARE_PROVIDER_SITE_OTHER): Payer: Medicaid Other | Admitting: Internal Medicine

## 2015-08-12 ENCOUNTER — Encounter: Payer: Self-pay | Admitting: Internal Medicine

## 2015-08-12 VITALS — BP 125/83 | HR 80 | Temp 97.7°F | Ht 66.0 in | Wt 230.5 lb

## 2015-08-12 DIAGNOSIS — R635 Abnormal weight gain: Secondary | ICD-10-CM

## 2015-08-12 DIAGNOSIS — B20 Human immunodeficiency virus [HIV] disease: Secondary | ICD-10-CM

## 2015-08-12 DIAGNOSIS — Z23 Encounter for immunization: Secondary | ICD-10-CM

## 2015-08-12 NOTE — Assessment & Plan Note (Signed)
Trying to lose by exercise.  Discussed lower carbs.

## 2015-08-12 NOTE — Assessment & Plan Note (Addendum)
Doing well, discussed an alarm for a reminder to take pills.  Provided pill box. RTC 6 months

## 2015-08-12 NOTE — Progress Notes (Signed)
   Subjective:    Patient ID: Kristy White, female    DOB: [birth year], 39 y.o.   MRN: [id number]  HPI Here for follow up of HIV.   On Prezista/n and Truvada.  Denies missed doses though has sporadic follow up.  CD4 up to 340, viral load remains undetectable.  Occasional missed doses, 1-2 per month.  Trying to lose weight.  Review of Systems  Constitutional: Negative for fatigue.  Neurological: Negative for light-headedness and headaches.       Objective:   Physical Exam  Constitutional: She appears well-developed and well-nourished. No distress.  HENT:  Mouth/Throat: No oropharyngeal exudate.  Eyes: No scleral icterus.  Cardiovascular: Normal rate, regular rhythm and normal heart sounds.   No murmur heard. Pulmonary/Chest: Effort normal and breath sounds normal. No respiratory distress.  Skin: No rash noted.          Assessment & Plan:

## 2015-09-04 ENCOUNTER — Other Ambulatory Visit: Payer: Self-pay | Admitting: Internal Medicine

## 2015-10-12 ENCOUNTER — Ambulatory Visit: Payer: Medicaid Other

## 2016-01-08 ENCOUNTER — Other Ambulatory Visit: Payer: Self-pay | Admitting: Infectious Diseases

## 2016-01-08 DIAGNOSIS — B20 Human immunodeficiency virus [HIV] disease: Secondary | ICD-10-CM

## 2016-02-10 ENCOUNTER — Other Ambulatory Visit: Payer: Medicaid Other

## 2016-02-10 DIAGNOSIS — B20 Human immunodeficiency virus [HIV] disease: Secondary | ICD-10-CM

## 2016-02-11 LAB — T-HELPER CELL (CD4) - (RCID CLINIC ONLY)
CD4 % Helper T Cell: 17 % — ABNORMAL LOW (ref 33–55)
CD4 T Cell Abs: 380 /uL — ABNORMAL LOW (ref 400–2700)

## 2016-02-11 LAB — HIV-1 RNA QUANT-NO REFLEX-BLD
HIV 1 RNA Quant: <20 copies/mL (ref <20)
HIV-1 RNA Quant, Log: <1.3 Log copies/mL (ref <1.30)

## 2016-02-24 ENCOUNTER — Ambulatory Visit: Payer: Medicaid Other | Admitting: Internal Medicine

## 2016-04-04 ENCOUNTER — Ambulatory Visit: Payer: Medicaid Other | Admitting: Internal Medicine

## 2016-07-02 ENCOUNTER — Other Ambulatory Visit: Payer: Self-pay | Admitting: Infectious Diseases

## 2016-07-02 DIAGNOSIS — B20 Human immunodeficiency virus [HIV] disease: Secondary | ICD-10-CM

## 2016-08-04 ENCOUNTER — Other Ambulatory Visit: Payer: Self-pay | Admitting: Infectious Diseases

## 2016-08-04 DIAGNOSIS — B20 Human immunodeficiency virus [HIV] disease: Secondary | ICD-10-CM

## 2016-08-07 ENCOUNTER — Other Ambulatory Visit: Payer: Self-pay | Admitting: *Deleted

## 2016-08-07 DIAGNOSIS — B20 Human immunodeficiency virus [HIV] disease: Secondary | ICD-10-CM

## 2016-08-07 MED ORDER — RITONAVIR 100 MG PO TABS: ORAL_TABLET | ORAL | 2 refills | Status: DC

## 2016-08-07 MED ORDER — EMTRICITABINE-TENOFOVIR DF 200-300 MG PO TABS: 1.0000 | ORAL_TABLET | Freq: Every day | ORAL | 2 refills | Status: DC

## 2016-11-12 ENCOUNTER — Other Ambulatory Visit: Payer: Self-pay | Admitting: Internal Medicine

## 2016-11-12 DIAGNOSIS — B20 Human immunodeficiency virus [HIV] disease: Secondary | ICD-10-CM

## 2016-12-01 ENCOUNTER — Other Ambulatory Visit: Payer: Self-pay | Admitting: *Deleted

## 2016-12-01 ENCOUNTER — Telehealth: Payer: Self-pay | Admitting: *Deleted

## 2016-12-01 ENCOUNTER — Other Ambulatory Visit: Payer: Self-pay | Admitting: Internal Medicine

## 2016-12-01 DIAGNOSIS — B20 Human immunodeficiency virus [HIV] disease: Secondary | ICD-10-CM

## 2016-12-01 DIAGNOSIS — Z79899 Other long term (current) drug therapy: Secondary | ICD-10-CM

## 2016-12-01 DIAGNOSIS — Z113 Encounter for screening for infections with a predominantly sexual mode of transmission: Secondary | ICD-10-CM

## 2016-12-01 MED ORDER — RITONAVIR 100 MG PO TABS: ORAL_TABLET | ORAL | 0 refills | Status: DC

## 2016-12-01 MED ORDER — EMTRICITABINE-TENOFOVIR DF 200-300 MG PO TABS: 1.0000 | ORAL_TABLET | Freq: Every day | ORAL | 0 refills | Status: DC

## 2016-12-01 MED ORDER — DARUNAVIR ETHANOLATE 800 MG PO TABS: ORAL_TABLET | ORAL | 0 refills | Status: DC

## 2016-12-01 NOTE — Telephone Encounter (Signed)
Patient called to schedule an appt and was last seen for labs 02/2016 and MD 08/2015. She stated she has not missed any doses of meds and did receive refills in January 2018. Lab appt scheduled for 12/06/16 and MD appt 12/28/16. Advised will send a one month refill only and she must keep upcoming appts.

## 2016-12-05 ENCOUNTER — Other Ambulatory Visit: Payer: Medicaid Other

## 2016-12-05 DIAGNOSIS — B20 Human immunodeficiency virus [HIV] disease: Secondary | ICD-10-CM

## 2016-12-05 DIAGNOSIS — Z79899 Other long term (current) drug therapy: Secondary | ICD-10-CM

## 2016-12-05 DIAGNOSIS — Z113 Encounter for screening for infections with a predominantly sexual mode of transmission: Secondary | ICD-10-CM

## 2016-12-05 LAB — COMPREHENSIVE METABOLIC PANEL
ALT: 15 U/L (ref 6–29)
AST: 15 U/L (ref 10–30)
Albumin: 3.5 g/dL — ABNORMAL LOW (ref 3.6–5.1)
Alkaline Phosphatase: 68 U/L (ref 33–115)
BUN: 7 mg/dL (ref 7–25)
CO2: 27 mmol/L (ref 20–31)
Calcium: 8.9 mg/dL (ref 8.6–10.2)
Chloride: 106 mmol/L (ref 98–110)
Creat: 0.65 mg/dL (ref 0.50–1.10)
Glucose, Bld: 84 mg/dL (ref 65–99)
Potassium: 4.2 mmol/L (ref 3.5–5.3)
Sodium: 139 mmol/L (ref 135–146)
Total Bilirubin: 0.5 mg/dL (ref 0.2–1.2)
Total Protein: 7.4 g/dL (ref 6.1–8.1)

## 2016-12-05 LAB — CBC WITH DIFFERENTIAL/PLATELET
Basophils Absolute: 0 cells/uL (ref 0–200)
Basophils Relative: 0 %
Eosinophils Absolute: 75 cells/uL (ref 15–500)
Eosinophils Relative: 1 %
HCT: 33.6 % — ABNORMAL LOW (ref 35.0–45.0)
Hemoglobin: 10.9 g/dL — ABNORMAL LOW (ref 11.7–15.5)
Lymphocytes Relative: 28 %
Lymphs Abs: 2100 cells/uL (ref 850–3900)
MCH: 28.6 pg (ref 27.0–33.0)
MCHC: 32.4 g/dL (ref 32.0–36.0)
MCV: 88.2 fL (ref 80.0–100.0)
MPV: 9.6 fL (ref 7.5–12.5)
Monocytes Absolute: 750 cells/uL (ref 200–950)
Monocytes Relative: 10 %
Neutro Abs: 4575 cells/uL (ref 1500–7800)
Neutrophils Relative %: 61 %
Platelets: 352 10*3/uL (ref 140–400)
RBC: 3.81 MIL/uL (ref 3.80–5.10)
RDW: 14 % (ref 11.0–15.0)
WBC: 7.5 10*3/uL (ref 3.8–10.8)

## 2016-12-05 LAB — LIPID PANEL
Cholesterol: 189 mg/dL (ref <200)
HDL: 44 mg/dL — ABNORMAL LOW (ref >50)
LDL Cholesterol: 108 mg/dL — ABNORMAL HIGH (ref <100)
Total CHOL/HDL Ratio: 4.3 Ratio (ref <5.0)
Triglycerides: 185 mg/dL — ABNORMAL HIGH (ref <150)
VLDL: 37 mg/dL — ABNORMAL HIGH (ref <30)

## 2016-12-06 LAB — RPR

## 2016-12-07 LAB — T-HELPER CELL (CD4) - (RCID CLINIC ONLY)
CD4 % Helper T Cell: 16 % — ABNORMAL LOW (ref 33–55)
CD4 T Cell Abs: 380 /uL — ABNORMAL LOW (ref 400–2700)

## 2016-12-07 LAB — HIV-1 RNA QUANT-NO REFLEX-BLD
HIV 1 RNA Quant: <20 copies/mL
HIV-1 RNA Quant, Log: <1.3 Log copies/mL

## 2016-12-28 ENCOUNTER — Ambulatory Visit (INDEPENDENT_AMBULATORY_CARE_PROVIDER_SITE_OTHER): Payer: Medicaid Other | Admitting: Internal Medicine

## 2016-12-28 ENCOUNTER — Encounter: Payer: Self-pay | Admitting: Internal Medicine

## 2016-12-28 VITALS — BP 110/74 | HR 72 | Temp 98.1°F | Wt 210.0 lb

## 2016-12-28 DIAGNOSIS — Z79899 Other long term (current) drug therapy: Secondary | ICD-10-CM | POA: Diagnosis not present

## 2016-12-28 DIAGNOSIS — R87612 Low grade squamous intraepithelial lesion on cytologic smear of cervix (LGSIL): Secondary | ICD-10-CM

## 2016-12-28 DIAGNOSIS — Z113 Encounter for screening for infections with a predominantly sexual mode of transmission: Secondary | ICD-10-CM

## 2016-12-28 DIAGNOSIS — B20 Human immunodeficiency virus [HIV] disease: Secondary | ICD-10-CM | POA: Diagnosis present

## 2016-12-29 ENCOUNTER — Other Ambulatory Visit: Payer: Self-pay | Admitting: Internal Medicine

## 2016-12-29 DIAGNOSIS — B20 Human immunodeficiency virus [HIV] disease: Secondary | ICD-10-CM

## 2016-12-29 NOTE — Assessment & Plan Note (Signed)
She is doing well and I will keep her medications the same.  If she follows up in 6 months, I will consider Descovy or a STR.   rtc 6 months.   I did disucss with her about PrEP for any partners and low likelihood with her suppressed of transmission.

## 2016-12-29 NOTE — Assessment & Plan Note (Signed)
Stable creat, LFTs

## 2016-12-29 NOTE — Assessment & Plan Note (Signed)
Screened negative 

## 2016-12-29 NOTE — Progress Notes (Signed)
CC: Follow up for HIV  Interval history: Currently is asymptomatic and well-controlled on Prezista, norvir and Truvada.  She has not been seen in about 2 years, though did have labs last year but in today since she can't get refills.   She tells me she doesn't like coming in here.  Since last visit no new issues.   Has no associated n/v/d.  Denies any missed doses.     She does talk about being lonely and not experiencing intimacy due to her HIV status and not feeling she can easily disclose to potential partners.    Prior to Admission medications   Medication Sig Start Date End Date Taking? Authorizing Provider  Darunavir Ethanolate (PREZISTA) 800 MG tablet TAKE 1 TABLET BY MOUTH DAILY WITH NORVIR 12/01/16  Yes Gardiner Barefootobert W Glade Strausser, MD  emtricitabine-tenofovir (TRUVADA) 200-300 MG tablet Take 1 tablet by mouth daily. 12/01/16  Yes Gardiner Barefootobert W Murel Wigle, MD  ritonavir (NORVIR) 100 MG TABS tablet TAKE 1 TABLET [place]TH [place]ILY [place] WITH PREZISTA 12/01/16  Yes Gardiner Barefootobert W Jeter Tomey, MD    Review of Systems Constitutional: negative for fatigue and malaise Gastrointestinal: negative for diarrhea Integument/breast: negative for rash All other systems reviewed and are negative    Physical Exam: CONSTITUTIONAL:in no apparent distress and alert  Vitals:   12/28/16 1619  BP: 110/74  Pulse: 72  Temp: 98.1 F (36.7 C)   Eyes: anicteric HENT: no thrush, no cervical lymphadenopathy Respiratory: Normal respiratory effort; CTA B Cardiovascular: RRR  Lab Results  Component Value Date   HIV1RNAQUANT <20 NOT DETECTED 12/05/2016   HIV1RNAQUANT <20 02/10/2016   HIV1RNAQUANT <20 06/22/2015   SH: not sexually active for about 7 years

## 2016-12-29 NOTE — Assessment & Plan Note (Signed)
Will schedule her for follow up PAP

## 2017-01-27 ENCOUNTER — Other Ambulatory Visit: Payer: Self-pay | Admitting: Internal Medicine

## 2017-01-27 DIAGNOSIS — B20 Human immunodeficiency virus [HIV] disease: Secondary | ICD-10-CM

## 2017-02-02 ENCOUNTER — Telehealth: Payer: Self-pay | Admitting: *Deleted

## 2017-02-02 ENCOUNTER — Ambulatory Visit: Payer: Medicaid Other

## 2017-02-02 NOTE — Telephone Encounter (Signed)
Patient needs to reschedule Annual PAP smear appt.

## 2017-06-14 ENCOUNTER — Ambulatory Visit: Payer: Self-pay | Admitting: Internal Medicine

## 2017-06-19 ENCOUNTER — Encounter: Payer: Self-pay | Admitting: Internal Medicine

## 2017-06-19 ENCOUNTER — Ambulatory Visit (INDEPENDENT_AMBULATORY_CARE_PROVIDER_SITE_OTHER): Payer: Medicaid Other | Admitting: Licensed Clinical Social Worker

## 2017-06-19 ENCOUNTER — Ambulatory Visit (INDEPENDENT_AMBULATORY_CARE_PROVIDER_SITE_OTHER): Payer: Medicaid Other | Admitting: Internal Medicine

## 2017-06-19 VITALS — BP 122/80 | HR 80 | Temp 98.0°F | Ht 66.0 in | Wt 226.0 lb

## 2017-06-19 DIAGNOSIS — F329 Major depressive disorder, single episode, unspecified: Secondary | ICD-10-CM | POA: Insufficient documentation

## 2017-06-19 DIAGNOSIS — Z7189 Other specified counseling: Secondary | ICD-10-CM | POA: Diagnosis not present

## 2017-06-19 DIAGNOSIS — F320 Major depressive disorder, single episode, mild: Secondary | ICD-10-CM | POA: Diagnosis not present

## 2017-06-19 DIAGNOSIS — Z23 Encounter for immunization: Secondary | ICD-10-CM | POA: Diagnosis not present

## 2017-06-19 DIAGNOSIS — B20 Human immunodeficiency virus [HIV] disease: Secondary | ICD-10-CM | POA: Diagnosis not present

## 2017-06-19 DIAGNOSIS — F32A Depression, unspecified: Secondary | ICD-10-CM | POA: Insufficient documentation

## 2017-06-19 DIAGNOSIS — Z7185 Encounter for immunization safety counseling: Secondary | ICD-10-CM

## 2017-06-19 DIAGNOSIS — R69 Illness, unspecified: Secondary | ICD-10-CM

## 2017-06-19 MED ORDER — BICTEGRAVIR-EMTRICITAB-TENOFOV 50-200-25 MG PO TABS: 1.0000 | ORAL_TABLET | Freq: Every day | ORAL | 11 refills | Status: DC

## 2017-06-19 NOTE — BH Specialist Note (Signed)
Integrated Behavioral Health Initial Visit  MRN: [id number] Name: Kristy Memoserri D Inglett   Session Start time: 11:20 am Session End time: 11:36 am Total time: 16 mins  Type of Service: Integrated Behavioral Health- Individual/Family Interpretor:No. Interpretor Name and Language: N/A   Warm Hand Off Completed.       SUBJECTIVE: Kristy White is a 41 y.o. female accompanied by patient. Patient was referred by Dr. Luciana Axeomer for possible mood disorder and life stressors.  Patient reports the following symptoms/concerns: Patient described her mood initially as "happy, jokey but tired" and then described her mood as "in and out".  Patient initially denied previous mental health diagnosis or treatment but later mentioned having a "previous depression".  Patient verbalized difficulty tolerating her mother's concern over the approaching storm and reported that she was tired of hearing about the storm, as it was bringing her mood down.  Patient also verbalized difficulties with managing emotional stability related to her income and gains/losses.  [facility]  OBJECTIVE: Mood: Anxious and Affect: within range Risk of harm to self or others: No plan to harm self or others  Thought process: tangential Thought content: logical  ASSESSMENT: Patient may be experiencing mood disturbance and may benefit from behavioral health services.  GOALS ADDRESSED: Patient will reduce symptoms of: anxiety and increase knowledge and/or ability of: coping skills and stress reduction and also: Increase healthy adjustment to current life circumstances and Increase adequate support systems for patient/family  INTERVENTIONS: Supportive Counseling   PLAN: 1. Follow up with behavioral health clinician on : Tues Sept 18th at 1:30 pm 2. At next session will complete depression and anxiety assessment  Vergia AlbertsSherry Tokiko Diefenderfer, [facility]

## 2017-06-19 NOTE — Assessment & Plan Note (Signed)
Counseled on flu vaccine and given today

## 2017-06-19 NOTE — Assessment & Plan Note (Signed)
Doing well.  I discussed benefits of newer regimens on long-term side effects and better tolerated and will change her to [facility].  Labs today and rtc 6 months.

## 2017-06-19 NOTE — Progress Notes (Signed)
   Subjective:    Patient ID: Kayleen Memoserri D Giannetti, female    DOB: 03-23-1976, 41 y.o.   MRN: [id number]  HPI Here for follow up of HIV Has been on DRV/r/TRV with very intermittent follow up.  I saw her in march after a 2 year abscence but had remained on medication.  Denies any missed doses.  She indicated she has had some depression but tells me she just needs someone to talk to. Does not feel she needs medication for it. No SI.  She had previously discussed with me issues with lonlieness due to her HIV status and hesitancy to disclose this to potential partners.  She otherwise is having no associated n/v/d.     Review of Systems  Constitutional: Negative for fatigue.  Gastrointestinal: Negative for diarrhea.  Skin: Negative for rash.  Neurological: Negative for dizziness.       Objective:   Physical Exam  Constitutional: She appears well-developed and well-nourished. No distress.  HENT:  Mouth/Throat: No oropharyngeal exudate.  Eyes: No scleral icterus.  Cardiovascular: Normal rate, regular rhythm and normal heart sounds.   No murmur heard. Pulmonary/Chest: Effort normal and breath sounds normal. No respiratory distress.  Lymphadenopathy:    She has no cervical adenopathy.  Skin: No rash noted.   SH: no tobacco       Assessment & Plan:

## 2017-06-19 NOTE — Assessment & Plan Note (Signed)
May be at least partly situational with isolation.  She will see Cordelia PenSherry to further discuss.

## 2017-06-20 LAB — T-HELPER CELL (CD4) - (RCID CLINIC ONLY)
CD4 % Helper T Cell: 20 % — ABNORMAL LOW (ref 33–55)
CD4 T Cell Abs: 370 /uL — ABNORMAL LOW (ref 400–2700)

## 2017-06-21 ENCOUNTER — Telehealth: Payer: Self-pay | Admitting: Licensed Clinical Social Worker

## 2017-06-21 LAB — HIV-1 RNA QUANT-NO REFLEX-BLD
HIV 1 RNA Quant: <20 copies/mL — AB
HIV-1 RNA Quant, Log: <1.3 Log copies/mL — AB

## 2017-06-21 NOTE — Telephone Encounter (Signed)
[facility] to [facility] appointment from 9/18 to another day due to all day training was just informed of.  [facility] could not leave voice mail and will continue to try to reach patient.  Vergia AlbertsSherry Mattheu Brodersen, [facility]

## 2017-06-22 ENCOUNTER — Telehealth: Payer: Self-pay | Admitting: Licensed Clinical Social Worker

## 2017-06-22 NOTE — Telephone Encounter (Signed)
[facility] attempted again to reach patient to reschedule appointment made for Tues Sept 18th.  [facility] will be at an all day orientation training and will not be able to meet with patients.  Vergia Alberts, [facility]

## 2017-06-26 ENCOUNTER — Ambulatory Visit: Payer: Medicaid Other | Admitting: Licensed Clinical Social Worker

## 2017-06-27 ENCOUNTER — Telehealth: Payer: Self-pay | Admitting: Licensed Clinical Social Worker

## 2017-06-27 NOTE — Telephone Encounter (Signed)
[facility] attempted to call patient to reschedule appointment and phone rang and was not able to leave voicemail message.  Vergia Alberts, [facility]

## 2017-08-04 ENCOUNTER — Other Ambulatory Visit: Payer: Self-pay | Admitting: Internal Medicine

## 2017-08-04 DIAGNOSIS — B20 Human immunodeficiency virus [HIV] disease: Secondary | ICD-10-CM

## 2017-12-11 ENCOUNTER — Other Ambulatory Visit: Payer: Self-pay | Admitting: *Deleted

## 2017-12-11 ENCOUNTER — Other Ambulatory Visit: Payer: Medicaid Other

## 2017-12-11 DIAGNOSIS — Z113 Encounter for screening for infections with a predominantly sexual mode of transmission: Secondary | ICD-10-CM

## 2017-12-11 DIAGNOSIS — Z79899 Other long term (current) drug therapy: Secondary | ICD-10-CM

## 2017-12-11 DIAGNOSIS — B20 Human immunodeficiency virus [HIV] disease: Secondary | ICD-10-CM

## 2017-12-12 LAB — COMPLETE METABOLIC PANEL WITH GFR
AG Ratio: 1 (calc) (ref 1.0–2.5)
ALT: 21 U/L (ref 6–29)
AST: 22 U/L (ref 10–30)
Albumin: 3.7 g/dL (ref 3.6–5.1)
Alkaline phosphatase (APISO): 74 U/L (ref 33–115)
BUN: 8 mg/dL (ref 7–25)
CO2: 25 mmol/L (ref 20–32)
Calcium: 9.2 mg/dL (ref 8.6–10.2)
Chloride: 103 mmol/L (ref 98–110)
Creat: 0.69 mg/dL (ref 0.50–1.10)
GFR, Est African American: 125 mL/min/{1.73_m2} (ref >=60)
GFR, Est Non African American: 108 mL/min/{1.73_m2} (ref >=60)
Globulin: 3.7 g/dL (calc) (ref 1.9–3.7)
Glucose, Bld: 103 mg/dL — ABNORMAL HIGH (ref 65–99)
Potassium: 4.5 mmol/L (ref 3.5–5.3)
Sodium: 136 mmol/L (ref 135–146)
Total Bilirubin: 0.4 mg/dL (ref 0.2–1.2)
Total Protein: 7.4 g/dL (ref 6.1–8.1)

## 2017-12-12 LAB — CBC WITH DIFFERENTIAL/PLATELET
Basophils Absolute: 40 cells/uL (ref 0–200)
Basophils Relative: 0.4 %
Eosinophils Absolute: 99 cells/uL (ref 15–500)
Eosinophils Relative: 1 %
HCT: 34.2 % — ABNORMAL LOW (ref 35.0–45.0)
Hemoglobin: 11.4 g/dL — ABNORMAL LOW (ref 11.7–15.5)
Lymphs Abs: 1822 cells/uL (ref 850–3900)
MCH: 28.4 pg (ref 27.0–33.0)
MCHC: 33.3 g/dL (ref 32.0–36.0)
MCV: 85.3 fL (ref 80.0–100.0)
MPV: 10.3 fL (ref 7.5–12.5)
Monocytes Relative: 6.8 %
Neutro Abs: 7267 cells/uL (ref 1500–7800)
Neutrophils Relative %: 73.4 %
Platelets: 334 10*3/uL (ref 140–400)
RBC: 4.01 10*6/uL (ref 3.80–5.10)
RDW: 12.6 % (ref 11.0–15.0)
Total Lymphocyte: 18.4 %
WBC mixed population: 673 cells/uL (ref 200–950)
WBC: 9.9 10*3/uL (ref 3.8–10.8)

## 2017-12-12 LAB — LIPID PANEL
Cholesterol: 177 mg/dL
HDL: 50 mg/dL — ABNORMAL LOW
LDL Cholesterol (Calc): 106 mg/dL — ABNORMAL HIGH
Non-HDL Cholesterol (Calc): 127 mg/dL
Total CHOL/HDL Ratio: 3.5 (calc)
Triglycerides: 116 mg/dL

## 2017-12-12 LAB — T-HELPER CELL (CD4) - (RCID CLINIC ONLY)
CD4 % Helper T Cell: 17 % — ABNORMAL LOW (ref 33–55)
CD4 T Cell Abs: 350 /uL — ABNORMAL LOW (ref 400–2700)

## 2017-12-12 LAB — RPR: RPR Ser Ql: NONREACTIVE

## 2017-12-13 LAB — HIV-1 RNA QUANT-NO REFLEX-BLD
HIV 1 RNA Quant: <20 copies/mL — AB
HIV-1 RNA Quant, Log: <1.3 Log copies/mL — AB

## 2017-12-25 ENCOUNTER — Ambulatory Visit: Payer: Medicaid Other | Admitting: Internal Medicine

## 2018-01-10 ENCOUNTER — Ambulatory Visit (INDEPENDENT_AMBULATORY_CARE_PROVIDER_SITE_OTHER): Payer: Self-pay | Admitting: Internal Medicine

## 2018-01-10 ENCOUNTER — Encounter: Payer: Self-pay | Admitting: Internal Medicine

## 2018-01-10 VITALS — BP 113/85 | HR 74 | Temp 98.0°F | Wt 242.0 lb

## 2018-01-10 DIAGNOSIS — B20 Human immunodeficiency virus [HIV] disease: Secondary | ICD-10-CM

## 2018-01-10 DIAGNOSIS — N926 Irregular menstruation, unspecified: Secondary | ICD-10-CM

## 2018-01-10 DIAGNOSIS — Z113 Encounter for screening for infections with a predominantly sexual mode of transmission: Secondary | ICD-10-CM

## 2018-01-10 DIAGNOSIS — F320 Major depressive disorder, single episode, mild: Secondary | ICD-10-CM

## 2018-01-10 MED ORDER — BICTEGRAVIR-EMTRICITAB-TENOFOV 50-200-25 MG PO TABS: 1.0000 | ORAL_TABLET | Freq: Every day | ORAL | 11 refills | Status: DC

## 2018-01-10 NOTE — Progress Notes (Signed)
   Subjective:    Patient ID: Kristy White, female    DOB: 05-30-1976, 42 y.o.   MRN: [id number]  HPI Here for follow up of HIV Has been on Biktarvy and does have possible occasional missed doses.  She sometimes forgets if she took it already or not.  Her labs though are good with a CD4 of 350 and viral load < 20.  Normal creat, normal AST/ALT.  No associated n/d.  Has not been to her gynecologist recently.  Now off of medicaid.  Some continued situational depression.    Review of Systems  Gastrointestinal: Negative for diarrhea.  Genitourinary: Positive for menstrual problem.  Skin: Negative for rash.       Objective:   Physical Exam  Constitutional: She appears well-developed and well-nourished. No distress.  HENT:  Mouth/Throat: No oropharyngeal exudate.  Eyes: No scleral icterus.  Cardiovascular: Normal rate, regular rhythm and normal heart sounds.  No murmur heard. Pulmonary/Chest: Effort normal and breath sounds normal. No respiratory distress.  Skin: No rash noted.   SH: no tobacco       Assessment & Plan:

## 2018-01-10 NOTE — Assessment & Plan Note (Signed)
I offered pap smear here but she prefers to go back to her gynecologist for pap and mentrual evaluation.

## 2018-01-10 NOTE — Assessment & Plan Note (Signed)
Screened negative 

## 2018-01-10 NOTE — Assessment & Plan Note (Signed)
Doing well. I encouraged use of a pill box to keep track  rtc 6 months

## 2018-01-10 NOTE — Assessment & Plan Note (Signed)
She visited with our counselor and will follow up with her as needed.

## 2018-04-09 ENCOUNTER — Telehealth: Payer: Self-pay | Admitting: *Deleted

## 2018-04-09 NOTE — Telephone Encounter (Signed)
Patient called, states [facility] told her she was unable to pick up medication because her coverage has run out. Patient is not sure what her coverage is, thought it lasted 1 year.  Patient did not complete her ADAP application in April, received Advancing Access. She states [facility] did not want to see her letter (pharmacy states they "found a loyalty card and a coupon card" to cover her since April, but the funds are depleted now). RN advised patient to try to find the letter at home and see if Advancing Access will cover her next fill, she will bring all documentation on Friday to reapply for ADAP.  RN reminded patient to reapply each January and July, bringing all required information to her appointment to avoid this gap in coverage. Andree CossHowell, Avonda Toso M, RN

## 2018-04-12 ENCOUNTER — Ambulatory Visit: Payer: Self-pay

## 2018-04-19 ENCOUNTER — Ambulatory Visit: Payer: Self-pay

## 2018-04-26 ENCOUNTER — Encounter: Payer: Self-pay | Admitting: Internal Medicine

## 2018-07-12 ENCOUNTER — Other Ambulatory Visit: Payer: Self-pay | Admitting: Internal Medicine

## 2018-07-15 ENCOUNTER — Other Ambulatory Visit: Payer: Self-pay

## 2018-07-29 ENCOUNTER — Encounter: Payer: Self-pay | Admitting: Internal Medicine

## 2018-11-23 ENCOUNTER — Other Ambulatory Visit: Payer: Self-pay | Admitting: Internal Medicine

## 2019-01-05 ENCOUNTER — Other Ambulatory Visit: Payer: Self-pay | Admitting: Internal Medicine

## 2019-01-07 ENCOUNTER — Other Ambulatory Visit: Payer: Self-pay

## 2019-01-20 ENCOUNTER — Ambulatory Visit: Payer: Self-pay

## 2019-01-20 ENCOUNTER — Ambulatory Visit: Payer: Self-pay | Admitting: Internal Medicine

## 2019-02-09 ENCOUNTER — Other Ambulatory Visit: Payer: Self-pay | Admitting: Internal Medicine

## 2019-03-22 ENCOUNTER — Other Ambulatory Visit: Payer: Self-pay | Admitting: Internal Medicine

## 2019-04-09 ENCOUNTER — Other Ambulatory Visit: Payer: Self-pay

## 2019-04-09 ENCOUNTER — Ambulatory Visit: Payer: Self-pay

## 2019-04-09 ENCOUNTER — Other Ambulatory Visit: Payer: Self-pay | Admitting: *Deleted

## 2019-04-09 DIAGNOSIS — Z79899 Other long term (current) drug therapy: Secondary | ICD-10-CM

## 2019-04-09 DIAGNOSIS — Z113 Encounter for screening for infections with a predominantly sexual mode of transmission: Secondary | ICD-10-CM

## 2019-04-09 DIAGNOSIS — B20 Human immunodeficiency virus [HIV] disease: Secondary | ICD-10-CM

## 2019-04-09 MED ORDER — BIKTARVY 50-200-25 MG PO TABS: 1.0000 | ORAL_TABLET | Freq: Every day | ORAL | 0 refills | Status: DC

## 2019-04-09 NOTE — Progress Notes (Signed)
Patient has been receiving medication via [place] program for last year. She will come in today for labs, financial counseling and will follow up with Dr Linus Salmons 7/15.  She is aware she will not get another refill of biktarvy if she does not keep these appointments.  She states she has only missed 2 pills in the last month. Lab orders placed, 30 day fill sent to [facility]. Landis Gandy, RN

## 2019-04-10 LAB — T-HELPER CELL (CD4) - (RCID CLINIC ONLY)
CD4 % Helper T Cell: 24 % — ABNORMAL LOW (ref 33–65)
CD4 T Cell Abs: 581 /uL (ref 400–1790)

## 2019-04-17 LAB — LIPID PANEL
Cholesterol: 148 mg/dL (ref <200)
HDL: 46 mg/dL — ABNORMAL LOW (ref >=50)
LDL Cholesterol (Calc): 82 mg/dL (calc)
Non-HDL Cholesterol (Calc): 102 mg/dL (calc) (ref <130)
Total CHOL/HDL Ratio: 3.2 (calc) (ref <5.0)
Triglycerides: 102 mg/dL (ref <150)

## 2019-04-17 LAB — COMPLETE METABOLIC PANEL WITH GFR
AG Ratio: 1 (calc) (ref 1.0–2.5)
ALT: 14 U/L (ref 6–29)
AST: 16 U/L (ref 10–30)
Albumin: 3.7 g/dL (ref 3.6–5.1)
Alkaline phosphatase (APISO): 70 U/L (ref 31–125)
BUN: 8 mg/dL (ref 7–25)
CO2: 26 mmol/L (ref 20–32)
Calcium: 8.8 mg/dL (ref 8.6–10.2)
Chloride: 105 mmol/L (ref 98–110)
Creat: 0.77 mg/dL (ref 0.50–1.10)
GFR, Est African American: 110 mL/min/{1.73_m2} (ref >=60)
GFR, Est Non African American: 95 mL/min/{1.73_m2} (ref >=60)
Globulin: 3.7 g/dL (calc) (ref 1.9–3.7)
Glucose, Bld: 84 mg/dL (ref 65–99)
Potassium: 4.3 mmol/L (ref 3.5–5.3)
Sodium: 138 mmol/L (ref 135–146)
Total Bilirubin: 0.3 mg/dL (ref 0.2–1.2)
Total Protein: 7.4 g/dL (ref 6.1–8.1)

## 2019-04-17 LAB — CBC WITH DIFFERENTIAL/PLATELET
Absolute Monocytes: 693 cells/uL (ref 200–950)
Basophils Absolute: 63 cells/uL (ref 0–200)
Basophils Relative: 0.7 %
Eosinophils Absolute: 126 cells/uL (ref 15–500)
Eosinophils Relative: 1.4 %
HCT: 32.6 % — ABNORMAL LOW (ref 35.0–45.0)
Hemoglobin: 10.8 g/dL — ABNORMAL LOW (ref 11.7–15.5)
Lymphs Abs: 2367 cells/uL (ref 850–3900)
MCH: 28.1 pg (ref 27.0–33.0)
MCHC: 33.1 g/dL (ref 32.0–36.0)
MCV: 84.9 fL (ref 80.0–100.0)
MPV: 10.8 fL (ref 7.5–12.5)
Monocytes Relative: 7.7 %
Neutro Abs: 5751 cells/uL (ref 1500–7800)
Neutrophils Relative %: 63.9 %
Platelets: 342 10*3/uL (ref 140–400)
RBC: 3.84 10*6/uL (ref 3.80–5.10)
RDW: 13.2 % (ref 11.0–15.0)
Total Lymphocyte: 26.3 %
WBC: 9 10*3/uL (ref 3.8–10.8)

## 2019-04-17 LAB — RPR: RPR Ser Ql: NONREACTIVE

## 2019-04-17 LAB — HIV-1 RNA QUANT-NO REFLEX-BLD
HIV 1 RNA Quant: <20 copies/mL
HIV-1 RNA Quant, Log: <1.3 Log copies/mL

## 2019-04-23 ENCOUNTER — Ambulatory Visit: Payer: Self-pay

## 2019-04-23 ENCOUNTER — Encounter: Payer: Self-pay | Admitting: Internal Medicine

## 2019-04-23 ENCOUNTER — Other Ambulatory Visit: Payer: Self-pay

## 2019-04-23 ENCOUNTER — Ambulatory Visit (INDEPENDENT_AMBULATORY_CARE_PROVIDER_SITE_OTHER): Payer: Self-pay | Admitting: Internal Medicine

## 2019-04-23 VITALS — BP 115/79 | HR 66 | Temp 97.7°F | Wt 244.0 lb

## 2019-04-23 DIAGNOSIS — Z79899 Other long term (current) drug therapy: Secondary | ICD-10-CM

## 2019-04-23 DIAGNOSIS — Z23 Encounter for immunization: Secondary | ICD-10-CM

## 2019-04-23 DIAGNOSIS — B20 Human immunodeficiency virus [HIV] disease: Secondary | ICD-10-CM

## 2019-04-23 DIAGNOSIS — R87612 Low grade squamous intraepithelial lesion on cytologic smear of cervix (LGSIL): Secondary | ICD-10-CM

## 2019-04-23 DIAGNOSIS — Z113 Encounter for screening for infections with a predominantly sexual mode of transmission: Secondary | ICD-10-CM

## 2019-04-23 DIAGNOSIS — N926 Irregular menstruation, unspecified: Secondary | ICD-10-CM

## 2019-04-23 NOTE — Assessment & Plan Note (Signed)
Will get her scheduled for a PAP smear

## 2019-04-23 NOTE — Assessment & Plan Note (Signed)
Doing well on treatment though with sporacid follow up.  I think yearly follow up will be fine.   rtc 1 year

## 2019-04-23 NOTE — Progress Notes (Signed)
   Subjective:    Patient ID: Kristy White, female    DOB: 04/19/1976, 43 y.o.   MRN: [id number]  HPI Here for follow up of HIV Has been on Biktarvy and does have possible occasional missed doses.  She sometimes forgets if she took it already or not.  Her labs though are good with a CD4 of 350 and viral load < 20.  Normal creat, normal AST/ALT.  No associated n/d.  Has not been to her gynecologist recently.  Now off of medicaid.  Some continued situational depression.    Review of Systems  Gastrointestinal: Negative for diarrhea.  Genitourinary: Positive for menstrual problem.  Skin: Negative for rash.       Objective:   Physical Exam  Constitutional: She appears well-developed and well-nourished. No distress.  HENT:  Mouth/Throat: No oropharyngeal exudate.  Eyes: No scleral icterus.  Cardiovascular: Normal rate, regular rhythm and normal heart sounds.  No murmur heard. Pulmonary/Chest: Effort normal and breath sounds normal. No respiratory distress.  Skin: No rash noted.   SH: no tobacco       Assessment & Plan:

## 2019-04-23 NOTE — Assessment & Plan Note (Signed)
Screened negative 

## 2019-04-23 NOTE — Assessment & Plan Note (Signed)
Discussed prevnar and given today Gave Menveo #1 as well.

## 2019-04-23 NOTE — Assessment & Plan Note (Signed)
No menstruation in 5 years.  She has never been evaluated for this to her knowledge.  She will be referred to a PCP

## 2019-05-06 ENCOUNTER — Other Ambulatory Visit: Payer: Self-pay | Admitting: Internal Medicine

## 2019-05-06 DIAGNOSIS — B20 Human immunodeficiency virus [HIV] disease: Secondary | ICD-10-CM

## 2019-05-07 ENCOUNTER — Encounter: Payer: Self-pay | Admitting: Internal Medicine

## 2019-06-02 ENCOUNTER — Ambulatory Visit: Payer: Self-pay | Admitting: Infectious Diseases

## 2019-07-21 ENCOUNTER — Telehealth: Payer: Self-pay | Admitting: *Deleted

## 2019-07-21 NOTE — Telephone Encounter (Signed)
Patient called to report that she has a small lump under her arm on her left breast. She does not have a GYN or PCP and would like for Dr Linus Salmons to see her and help her figure out what to do if anything. Gave her an office visit to see [place] Wednesday 07/23/19.

## 2019-07-23 ENCOUNTER — Other Ambulatory Visit: Payer: Self-pay

## 2019-07-23 ENCOUNTER — Ambulatory Visit (INDEPENDENT_AMBULATORY_CARE_PROVIDER_SITE_OTHER): Payer: Self-pay | Admitting: Internal Medicine

## 2019-07-23 ENCOUNTER — Encounter: Payer: Self-pay | Admitting: Internal Medicine

## 2019-07-23 VITALS — BP 111/81 | HR 76 | Temp 97.8°F | Wt 238.9 lb

## 2019-07-23 DIAGNOSIS — B20 Human immunodeficiency virus [HIV] disease: Secondary | ICD-10-CM

## 2019-07-23 DIAGNOSIS — N63 Unspecified lump in unspecified breast: Secondary | ICD-10-CM | POA: Insufficient documentation

## 2019-07-23 DIAGNOSIS — N926 Irregular menstruation, unspecified: Secondary | ICD-10-CM

## 2019-07-23 DIAGNOSIS — R87612 Low grade squamous intraepithelial lesion on cytologic smear of cervix (LGSIL): Secondary | ICD-10-CM

## 2019-07-23 NOTE — Progress Notes (Signed)
   Subjective:    Patient ID: Kristy White, female    DOB: 1976-03-21, 43 y.o.   MRN: [id number]  HPI She is here for a work in visit for concern of a breast lump. She noticed this last month, was bigger at the time during her menstrual period, smaller since then.  Never had it before.  Has not had a mammogram before.  No associated weight loss.  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  She doesn't like coming in to this office.  Also very irregular menstrual periods.  Has gone over 2 years without in the past.     Review of Systems  Constitutional: Negative for fatigue and unexpected weight change.  Genitourinary: Positive for menstrual problem. Negative for pelvic pain, vaginal bleeding and vaginal pain.  Skin: Negative for rash.       Objective:   Physical Exam Constitutional:      Appearance: Normal appearance.  Eyes:     General: No scleral icterus. Pulmonary:     Effort: Pulmonary effort is normal.  Lymphadenopathy:     Comments: Left axillary region with small nodule at edge of breast tissue.   Skin:    Findings: No rash.  Neurological:     General: No focal deficit present.     Mental Status: She is alert.  Psychiatric:        Mood and Affect: Mood normal.   SH: no tobacco       Assessment & Plan:

## 2019-07-23 NOTE — Assessment & Plan Note (Signed)
Small area and I feel a mammogram is indicated.  Forms filled out for help with this.

## 2019-07-23 NOTE — Assessment & Plan Note (Signed)
Will get her set up for a PAP smear here.

## 2019-07-23 NOTE — Assessment & Plan Note (Signed)
Doing well and no issues today

## 2019-07-23 NOTE — Assessment & Plan Note (Signed)
I am unsure of etiology but long standing.  We will get her into primary care to help facilitate any work up, if indicated.

## 2019-07-28 ENCOUNTER — Encounter: Payer: Self-pay | Admitting: Infectious Diseases

## 2019-07-28 ENCOUNTER — Ambulatory Visit (INDEPENDENT_AMBULATORY_CARE_PROVIDER_SITE_OTHER): Payer: Self-pay | Admitting: Infectious Diseases

## 2019-07-28 ENCOUNTER — Other Ambulatory Visit: Payer: Self-pay

## 2019-07-28 DIAGNOSIS — R87612 Low grade squamous intraepithelial lesion on cytologic smear of cervix (LGSIL): Secondary | ICD-10-CM

## 2019-07-28 DIAGNOSIS — N912 Amenorrhea, unspecified: Secondary | ICD-10-CM | POA: Insufficient documentation

## 2019-07-28 DIAGNOSIS — F320 Major depressive disorder, single episode, mild: Secondary | ICD-10-CM

## 2019-07-28 DIAGNOSIS — Z113 Encounter for screening for infections with a predominantly sexual mode of transmission: Secondary | ICD-10-CM

## 2019-07-28 DIAGNOSIS — N63 Unspecified lump in unspecified breast: Secondary | ICD-10-CM

## 2019-07-28 DIAGNOSIS — Z124 Encounter for screening for malignant neoplasm of cervix: Secondary | ICD-10-CM

## 2019-07-28 DIAGNOSIS — B20 Human immunodeficiency virus [HIV] disease: Secondary | ICD-10-CM

## 2019-07-28 NOTE — Assessment & Plan Note (Signed)
Small amount of white cervicovaginal discharge. Cervix bled easily.  Cervical brushing collected for cytology and HPV. Check gonorrhea/chlamydia/trichomonas. Discussed recommended screening interval for women living with HIV disease meant to be lifelong and at an interval of Q1-3 years pending results. Acceptable to space out Q3y with 3 consecutively normal exams. Further recommendations for Kristy White to follow today's results.  Results will be communicated to the patient via telephone call.

## 2019-07-28 NOTE — Assessment & Plan Note (Signed)
Well controlled with VL < 20 / CD4 581. She will continue her Biktarvy and follow up with Dr. Linus Salmons as scheduled.  Pap smear updated today.   She does not desire any further children. Plans for condom use for contraception if she engages in sex again, but no plans.

## 2019-07-28 NOTE — Progress Notes (Signed)
Subjective:    Kristy White is a 43 y.o. female here for an annual pelvic exam and pap smear.    Review of Systems: Current GYN complaints or concerns: very irregular periods - went 2-3 years without a single normal cycle up until July of 2020 when she finally resumed her typical 3 day long light monthly menses. During the 2-3 years without a cycle she mentioned that periodically she would get spotting for a day then it would stop. She has not had any sexual partners since before 2014.   Does not recall when her last pap smear was and is very nervous about getting one today.   Has been having a hard time with anxiety and depressed mood for a few months now and is not sleeping well due to it. She works a lot as a Hospital doctor. Feels as if things will get better soon as she is preparing to move out of her mothers house, who she states is very negative.    Patient denies any abdominal/pelvic pain, problems with bowel movements, urination, vaginal discharge or intercourse.   Past Medical History:  Diagnosis Date  . HIV infection [facility])     Gynecologic History: [id number]  Patient's last menstrual period was 07/21/2019. Contraception: abstinence Last Pap: 2014. 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 She is alert and oriented x3.  Pelvic: External genitalia is normal in appearance. The vagina is normal in appearance. The cervix is bulbous and easily visualized. No CMT, small white cervicovaginal discharge present, bleeding following brushing. Bimanual exam deferred by patient.  Psych: She has a normal mood and affect.    Assessment:   Problem List Items Addressed This Visit      High   Human immunodeficiency virus (HIV) disease ([place]) (Chronic)    Well controlled with VL < 20 / CD4 581. She will continue her Biktarvy and  follow up with Dr. Linus Salmons as scheduled.  Pap smear updated today.   She does not desire any further children. Plans for condom use for contraception if she engages in sex again, but no plans.         Unprioritized   Abnormal Pap smear of cervix    Small amount of white cervicovaginal discharge. Cervix bled easily.  Cervical brushing collected for cytology and HPV. Check gonorrhea/chlamydia/trichomonas. Discussed recommended screening interval for women living with HIV disease meant to be lifelong and at an interval of Q1-3 years pending results. Acceptable to space out Q3y with 3 consecutively normal exams. Further recommendations for SHAQUISHA WYNN to follow today's results.  Results will be communicated to the patient via telephone call.         Screening examination for venereal disease   Relevant Orders   Urine cytology ancillary only   Depression    Acutely worsened. She previously had good success with counseling efforts but due to financial constraints had to stop. I will get her in to see Marcie Bal - she is appreciative and very willing to participate.       Breast lump in female    Awaiting mammogram to be scheduled.       Amenorrhea, unspecified - Primary    Existing problem that has not been worked up yet.  Previously had 2-3 years of reported complete amenorrhea; however does seem that she had intermittent spotting  during this timeframe. Her cycles have returned normally since July. Uncertain as to the cause but I offered her opportunity to check bloodwork on day 19-21 to assess FSH/LH, Estrogen/Progesterone, TSH and A1C/fasting insulin level vs referral to gynecology now. She would like to start with blood work here and consider gyn referral later.  Will screen for STIs today - had trichomonas in the past and unclear if this was ever treated; cervix was a bit hyperemic and bled easily today - may be due to recent cessation of menses or untreated STI.       Relevant Orders    FSH   LH   TSH   Insulin, Free (Bioactive)-([facility])   Hemoglobin A1c   Estrogens, total   Progesterone    Other Visit Diagnoses    Screening for cervical cancer       Relevant Orders   Cytology - PAP( [place])       Rexene Alberts, MSN, NP-C [facility] Office: [phone] Pager: [phone]  07/28/19 1:30 PM

## 2019-07-28 NOTE — Assessment & Plan Note (Signed)
Acutely worsened. She previously had good success with counseling efforts but due to financial constraints had to stop. I will get her in to see Marcie Bal - she is appreciative and very willing to participate.

## 2019-07-28 NOTE — Assessment & Plan Note (Signed)
Awaiting mammogram to be scheduled.

## 2019-07-28 NOTE — Patient Instructions (Addendum)
Please schedule a lab appointment either Friday October 30th or Monday November 2nd to check some hormone levels to see if we can explain what is going on with your cycles.   We may need to refer you to Gynecology to help [place] out further.   Please schedule an appointment with Marcie Bal our counselor here in the clinic. She is wonderful and I think you would feel better talking about some of what you are going through and allowing time to take care of you!

## 2019-07-28 NOTE — Assessment & Plan Note (Addendum)
Existing problem that has not been worked up yet.  Previously had 2-3 years of reported complete amenorrhea; however does seem that she had intermittent spotting during this timeframe. Her cycles have returned normally since July. Uncertain as to the cause but I offered her opportunity to check bloodwork on day 19-21 to assess FSH/LH, Estrogen/Progesterone, TSH and A1C/fasting insulin level vs referral to gynecology now. She would like to start with blood work here and consider gyn referral later.  Will screen for STIs today - had trichomonas in the past and unclear if this was ever treated; cervix was a bit hyperemic and bled easily today - may be due to recent cessation of menses or untreated STI.

## 2019-07-31 ENCOUNTER — Telehealth: Payer: Self-pay | Admitting: *Deleted

## 2019-07-31 DIAGNOSIS — A64 Unspecified sexually transmitted disease: Secondary | ICD-10-CM

## 2019-07-31 LAB — CYTOLOGY - PAP
Comment: NEGATIVE
Diagnosis: NEGATIVE
High risk HPV: NEGATIVE

## 2019-07-31 LAB — URINE CYTOLOGY ANCILLARY ONLY
Chlamydia: NEGATIVE
Comment: NEGATIVE
Comment: NEGATIVE
Comment: NORMAL
Neisseria Gonorrhea: NEGATIVE
Trichomonas: POSITIVE — AB

## 2019-07-31 MED ORDER — METRONIDAZOLE 500 MG PO TABS: 500.0000 mg | ORAL_TABLET | Freq: Two times a day (BID) | ORAL | 0 refills | Status: DC

## 2019-07-31 NOTE — Addendum Note (Signed)
Addended by: [place] Callas on: 07/31/2019 01:12 PM   Modules accepted: Orders

## 2019-07-31 NOTE — Telephone Encounter (Signed)
Per Colletta Maryland called patient to advise her of the results of her recent PAP. Advised patient her PAP was normal and it is recommended she repeat in 3 years. Also advised her she tested positive for Trichomonas and that medication was sent to her pharmacy. Advised her to to take with food and no alcohol. She was very concerned because she has not had sex in 58 years.

## 2019-07-31 NOTE — Telephone Encounter (Signed)
-----   Message from [place] Callas, NP sent at 07/31/2019  1:12 PM EDT ----- Please give Kristy White a call to let her know she has a trichomonas infection. She will need to take metronidazole 1 tablet twice a day with food for a week.  Avoid all alcohol. If she has a regular sexual partner they will need to contact their doctor or [facility] for treatment to avoid re-infection for her.  Still waiting on the pap smear results.

## 2019-08-04 ENCOUNTER — Telehealth (HOSPITAL_COMMUNITY): Payer: Self-pay

## 2019-08-04 NOTE — Telephone Encounter (Signed)
Telephoned patient at home number. Patient answered, unable to talk. She will call back at a later time to schedule with [facility].

## 2019-08-05 ENCOUNTER — Telehealth: Payer: Self-pay | Admitting: *Deleted

## 2019-08-05 NOTE — Telephone Encounter (Signed)
-----   Message from [place] Callas, NP sent at 07/31/2019  2:28 PM EDT ----- Normal pap smear and no HPV. Can continue screenings every 3 years. I updated her Health Maintenance records.

## 2019-08-05 NOTE — Telephone Encounter (Signed)
Per Colletta Maryland called the patient to give her the results of her recent PAP had to leave a message for her to call the office.

## 2019-08-11 ENCOUNTER — Other Ambulatory Visit: Payer: Self-pay

## 2019-12-03 ENCOUNTER — Other Ambulatory Visit: Payer: Self-pay

## 2019-12-03 DIAGNOSIS — Z1231 Encounter for screening mammogram for malignant neoplasm of breast: Secondary | ICD-10-CM

## 2019-12-13 ENCOUNTER — Ambulatory Visit: Payer: Self-pay | Attending: Internal Medicine

## 2019-12-13 DIAGNOSIS — Z23 Encounter for immunization: Secondary | ICD-10-CM | POA: Insufficient documentation

## 2019-12-13 NOTE — Progress Notes (Signed)
   Covid-19 Vaccination Clinic  Name:  Kristy White    MRN: [id number] DOB: 06/22/1976  12/13/2019  Kristy White was observed post Covid-19 immunization for 15 minutes without incident. She was provided with Vaccine Information Sheet and instruction to access the V-Safe system.   Kristy White was instructed to call 911 with any severe reactions post vaccine: Marland Kitchen Difficulty breathing  . Swelling of face and throat  . A fast heartbeat  . A bad rash all over body  . Dizziness and weakness   Immunizations Administered    Name Date Dose VIS Date Route   Pfizer COVID-19 Vaccine 12/13/2019  2:44 PM 0.3 mL 09/19/2019 Intramuscular   Manufacturer: [employer], [employer]   Lot: [id number]   NDC: [address]-9

## 2019-12-22 ENCOUNTER — Ambulatory Visit: Payer: Self-pay

## 2019-12-25 ENCOUNTER — Encounter: Payer: Self-pay | Admitting: Women's Health

## 2019-12-25 ENCOUNTER — Ambulatory Visit: Payer: Self-pay | Admitting: Women's Health

## 2019-12-25 ENCOUNTER — Ambulatory Visit: Payer: Self-pay

## 2019-12-25 ENCOUNTER — Other Ambulatory Visit: Payer: Self-pay | Admitting: Obstetrics and Gynecology

## 2019-12-25 ENCOUNTER — Other Ambulatory Visit: Payer: Self-pay

## 2019-12-25 ENCOUNTER — Ambulatory Visit
Admission: RE | Admit: 2019-12-25 | Discharge: 2019-12-25 | Disposition: A | Discharge status: Home / Self Care | Payer: No Typology Code available for payment source | Source: Ambulatory Visit | Attending: Obstetrics and Gynecology | Admitting: Obstetrics and Gynecology

## 2019-12-25 VITALS — BP 128/88 | Temp 97.7°F | Wt 261.0 lb

## 2019-12-25 DIAGNOSIS — Z1231 Encounter for screening mammogram for malignant neoplasm of breast: Secondary | ICD-10-CM

## 2019-12-25 DIAGNOSIS — Z1239 Encounter for other screening for malignant neoplasm of breast: Secondary | ICD-10-CM

## 2019-12-25 NOTE — Patient Instructions (Signed)
Breast Self-Awareness Breast self-awareness means being familiar with how your breasts look and feel. It involves checking your breasts regularly and reporting any changes to your health care provider. Practicing breast self-awareness is important. Sometimes changes may not be harmful (are benign), but sometimes a change in your breasts can be a sign of a serious medical problem. It is important to learn how to do this procedure correctly so that you can catch problems early, when treatment is more likely to be successful. All women should practice breast self-awareness, including women who have had breast implants. What you need:  A mirror.  A well-lit room. How to do a breast self-exam A breast self-exam is one way to learn what is normal for your breasts and whether your breasts are changing. To do a breast self-exam: Look for changes  1. Remove all the clothing above your waist. 2. Stand in front of a mirror in a room with good lighting. 3. Put your hands on your hips. 4. Push your hands firmly downward. 5. Compare your breasts in the mirror. Look for differences between them (asymmetry), such as: ? Differences in shape. ? Differences in size. ? Puckers, dips, and bumps in one breast and not the other. 6. Look at each breast for changes in the skin, such as: ? Redness. ? Scaly areas. 7. Look for changes in your nipples, such as: ? Discharge. ? Bleeding. ? Dimpling. ? Redness. ? A change in position. Feel for changes Carefully feel your breasts for lumps and changes. It is best to do this while lying on your back on the floor, and again while sitting or standing in the tub or shower with soapy water on your skin. Feel each breast in the following way: 1. Place the arm on the side of the breast you are examining above your head. 2. Feel your breast with the other hand. 3. Start in the nipple area and make -inch (2 cm) overlapping circles to feel your breast. Use the pads of your  three middle fingers to do this. Apply light pressure, then medium pressure, then firm pressure. The light pressure will allow you to feel the tissue closest to the skin. The medium pressure will allow you to feel the tissue that is a little deeper. The firm pressure will allow you to feel the tissue close to the ribs. 4. Continue the overlapping circles, moving downward over the breast until you feel your ribs below your breast. 5. Move one finger-width toward the center of the body. Continue to use the -inch (2 cm) overlapping circles to feel your breast as you move slowly up toward your collarbone. 6. Continue the up-and-down exam using all three pressures until you reach your armpit.  Write down what you find Writing down what you find can help you remember what to discuss with your health care provider. Write down:  What is normal for each breast.  Any changes that you find in each breast, including: ? The kind of changes you find. ? Any pain or tenderness. ? Size and location of any lumps.  Where you are in your menstrual cycle, if you are still menstruating. General tips and recommendations  Examine your breasts every month.  If you are breastfeeding, the best time to examine your breasts is after a feeding or after using a breast pump.  If you menstruate, the best time to examine your breasts is 5-7 days after your period. Breasts are generally lumpier during menstrual periods, and it may   be more difficult to notice changes.  With time and practice, you will become more familiar with the variations in your breasts and more comfortable with the exam. Contact a health care provider if you:  See a change in the shape or size of your breasts or nipples.  See a change in the skin of your breast or nipples, such as a reddened or scaly area.  Have unusual discharge from your nipples.  Find a lump or thick area that was not there before.  Have pain in your breasts.  Have any  concerns related to your breast health. Summary  Breast self-awareness includes looking for physical changes in your breasts, as well as feeling for any changes within your breasts.  Breast self-awareness should be performed in front of a mirror in a well-lit room.  You should examine your breasts every month. If you menstruate, the best time to examine your breasts is 5-7 days after your menstrual period.  Let your health care provider know of any changes you notice in your breasts, including changes in size, changes on the skin, pain or tenderness, or unusual fluid from your nipples. This information is not intended to replace advice given to you by your health care provider. Make sure you discuss any questions you have with your health care provider. Document Revised: 05/14/2018 Document Reviewed: 05/14/2018 Elsevier Patient Education  2020 Elsevier Inc.  

## 2019-12-25 NOTE — Progress Notes (Signed)
[name] is a 44 y.o. female who presents to [facility] clinic today with complaint of sharp pain in upper, left breast x1 day that she rates as 4/10. Pt reports the pain occurred for less than 10 seconds last night and is not present at this time. Patient denies any lumps.   Pap Smear: Pap not smear completed today. Last Pap smear was 07/28/2019 at [facility] clinic and was normal. Per patient has no history of an abnormal Pap smear. Last Pap smear result is available in Epic.   Physical exam: Breasts Breasts symmetrical. No skin abnormalities bilateral breasts. No nipple retraction bilateral breasts. No nipple discharge bilateral breasts. No lymphadenopathy. No lumps palpated bilateral breasts.       Pelvic/Bimanual Pap is not indicated today    Smoking History: Patient has never smoked not referred to quit line.    Patient Navigation: Patient education provided. Access to services provided for patient through [facility] program. No interpreter provided. No transportation provided   Colorectal Cancer Screening: Per patient has never had colonoscopy completed No complaints today.    Breast and Cervical Cancer Risk Assessment: Patient does not have family history of breast cancer, known genetic mutations, or radiation treatment to the chest before age 39. Patient does not have history of cervical dysplasia, immunocompromised, or DES exposure in-utero.  Risk Assessment    Risk Scores      12/25/2019   Last edited by: Narda Rutherford, LPN   5-year risk: 0.6 %   Lifetime risk: 7.3 %          A: [facility] exam without pap smear Complaint of sharp pain in upper, left breast x1 day that she rates as 4/10. Pt reports the pain occurred for less than 10 seconds last night and is not present at this time. Patient denies any lumps.  P: Referred patient to the [facility] for a screening mammogram. Appointment scheduled 12/25/2019.  Marylen Ponto, NP 12/25/2019 10:56 AM

## 2019-12-29 ENCOUNTER — Other Ambulatory Visit: Payer: Self-pay

## 2019-12-29 ENCOUNTER — Ambulatory Visit: Payer: Self-pay

## 2019-12-29 ENCOUNTER — Encounter: Payer: Self-pay | Admitting: Internal Medicine

## 2019-12-29 ENCOUNTER — Other Ambulatory Visit: Payer: Self-pay | Admitting: Obstetrics and Gynecology

## 2019-12-29 DIAGNOSIS — R928 Other abnormal and inconclusive findings on diagnostic imaging of breast: Secondary | ICD-10-CM

## 2020-01-01 ENCOUNTER — Other Ambulatory Visit: Payer: Self-pay

## 2020-01-01 ENCOUNTER — Ambulatory Visit
Admission: RE | Admit: 2020-01-01 | Discharge: 2020-01-01 | Disposition: A | Discharge status: Home / Self Care | Payer: No Typology Code available for payment source | Source: Ambulatory Visit | Attending: Obstetrics and Gynecology | Admitting: Obstetrics and Gynecology

## 2020-01-01 DIAGNOSIS — R928 Other abnormal and inconclusive findings on diagnostic imaging of breast: Secondary | ICD-10-CM

## 2020-01-03 ENCOUNTER — Ambulatory Visit: Payer: No Typology Code available for payment source | Attending: Internal Medicine

## 2020-01-03 DIAGNOSIS — Z23 Encounter for immunization: Secondary | ICD-10-CM

## 2020-01-03 NOTE — Progress Notes (Signed)
   Covid-19 Vaccination Clinic  Name:  Kristy White    MRN: [id number] DOB: 10-Feb-1976  01/03/2020  [name] was observed post Covid-19 immunization for 15 minutes without incident. She was provided with Vaccine Information Sheet and instruction to access the V-Safe system.   [name] was instructed to call 911 with any severe reactions post vaccine: Marland Kitchen Difficulty breathing  . Swelling of face and throat  . A fast heartbeat  . A bad rash all over body  . Dizziness and weakness   Immunizations Administered    Name Date Dose VIS Date Route   Pfizer COVID-19 Vaccine 01/03/2020  3:44 PM 0.3 mL 09/19/2019 Intramuscular   Manufacturer: [employer], [employer]   Lot: [id number]   NDC: [address]-3

## 2020-01-07 ENCOUNTER — Other Ambulatory Visit: Payer: Self-pay | Admitting: Internal Medicine

## 2020-01-07 DIAGNOSIS — B20 Human immunodeficiency virus [HIV] disease: Secondary | ICD-10-CM

## 2020-04-19 ENCOUNTER — Other Ambulatory Visit: Payer: Self-pay

## 2020-04-22 ENCOUNTER — Other Ambulatory Visit: Payer: Self-pay

## 2020-05-06 ENCOUNTER — Encounter: Payer: Self-pay | Admitting: Internal Medicine

## 2020-05-10 ENCOUNTER — Encounter: Payer: Self-pay | Admitting: Internal Medicine

## 2020-06-25 IMAGING — MG MM DIGITAL DIAGNOSTIC UNILAT*R* W/ TOMO W/ CAD
4 series · 4 of 12 positions shown · non-contrast
Comparison: Baseline screening mammogram dated 12/25/2019.

CLINICAL DATA: Patient returns today to evaluate a possible RIGHT
breast mass identified on recent screening mammogram.

EXAM:
DIGITAL DIAGNOSTIC RIGHT MAMMOGRAM WITH CAD AND TOMO
ULTRASOUND RIGHT BREAST

[R CC synth-2D]
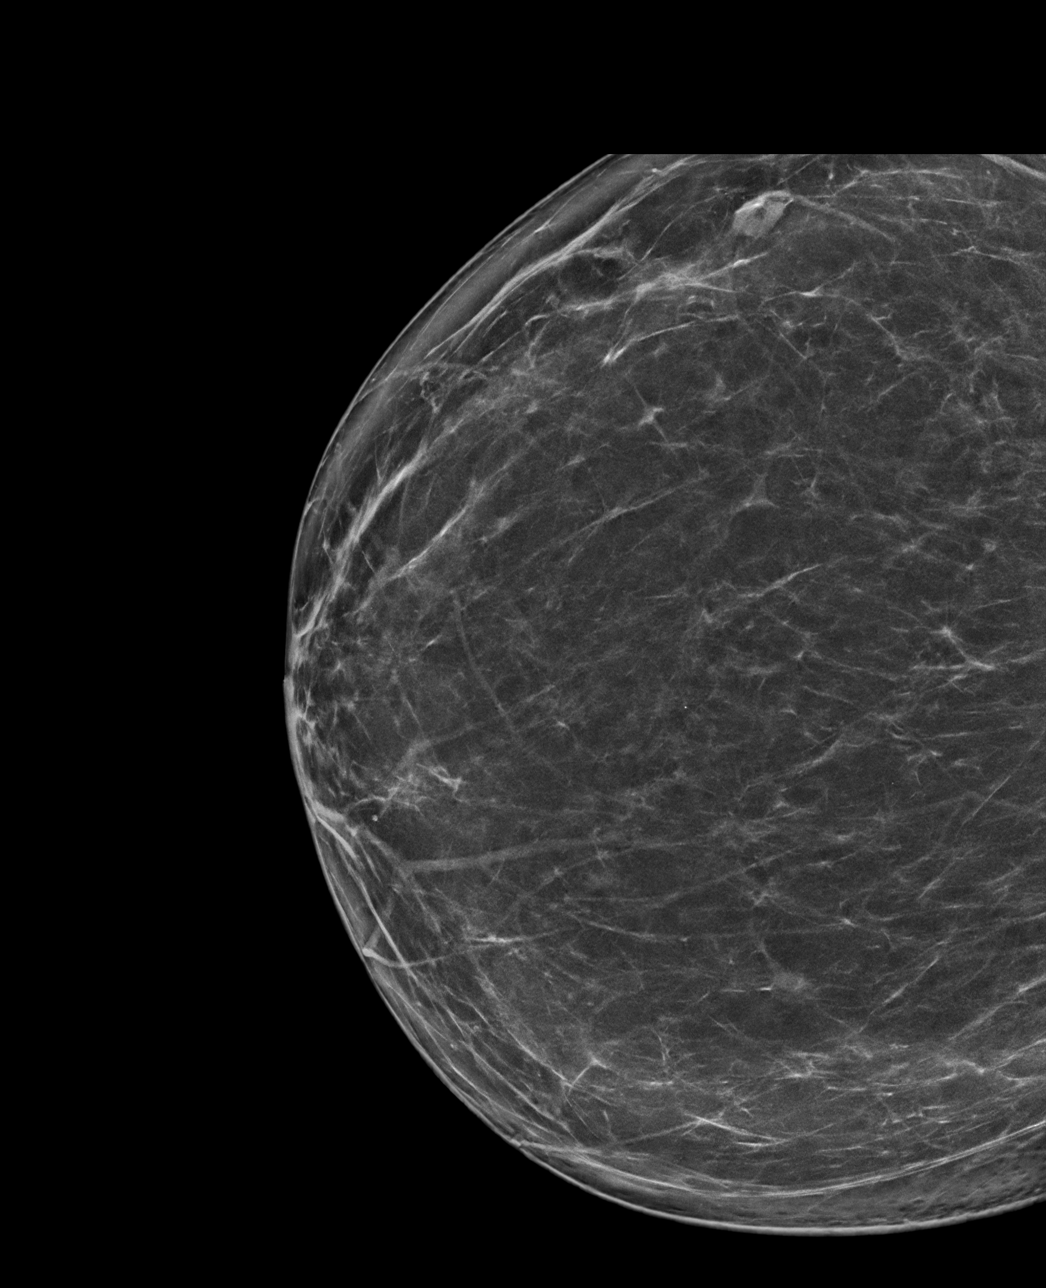

[R MLO synth-2D]
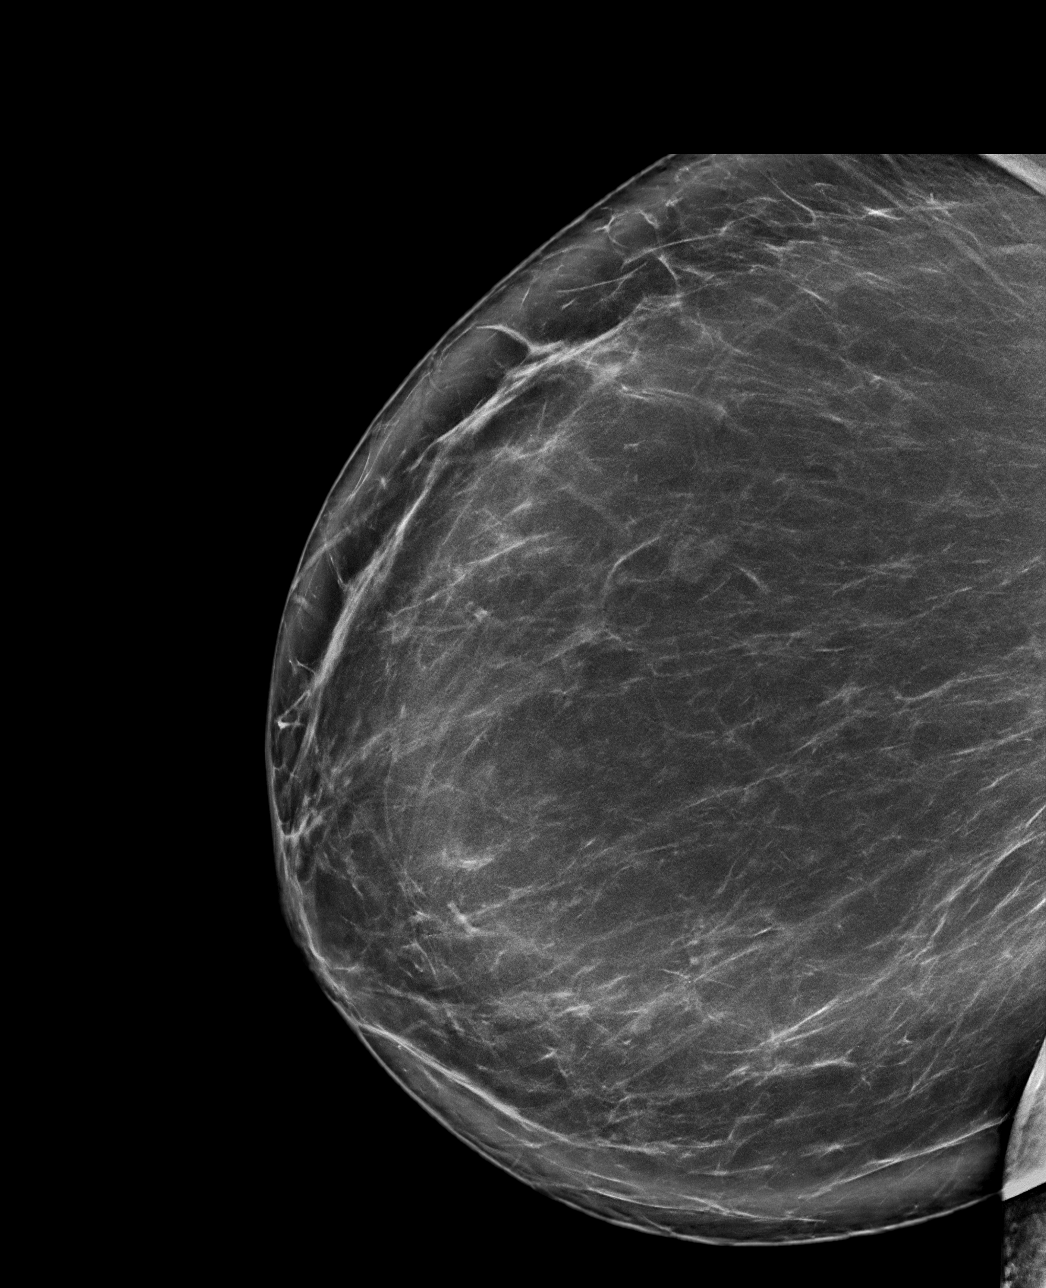

[R MLO tomo · tomo slice 55/109.0]
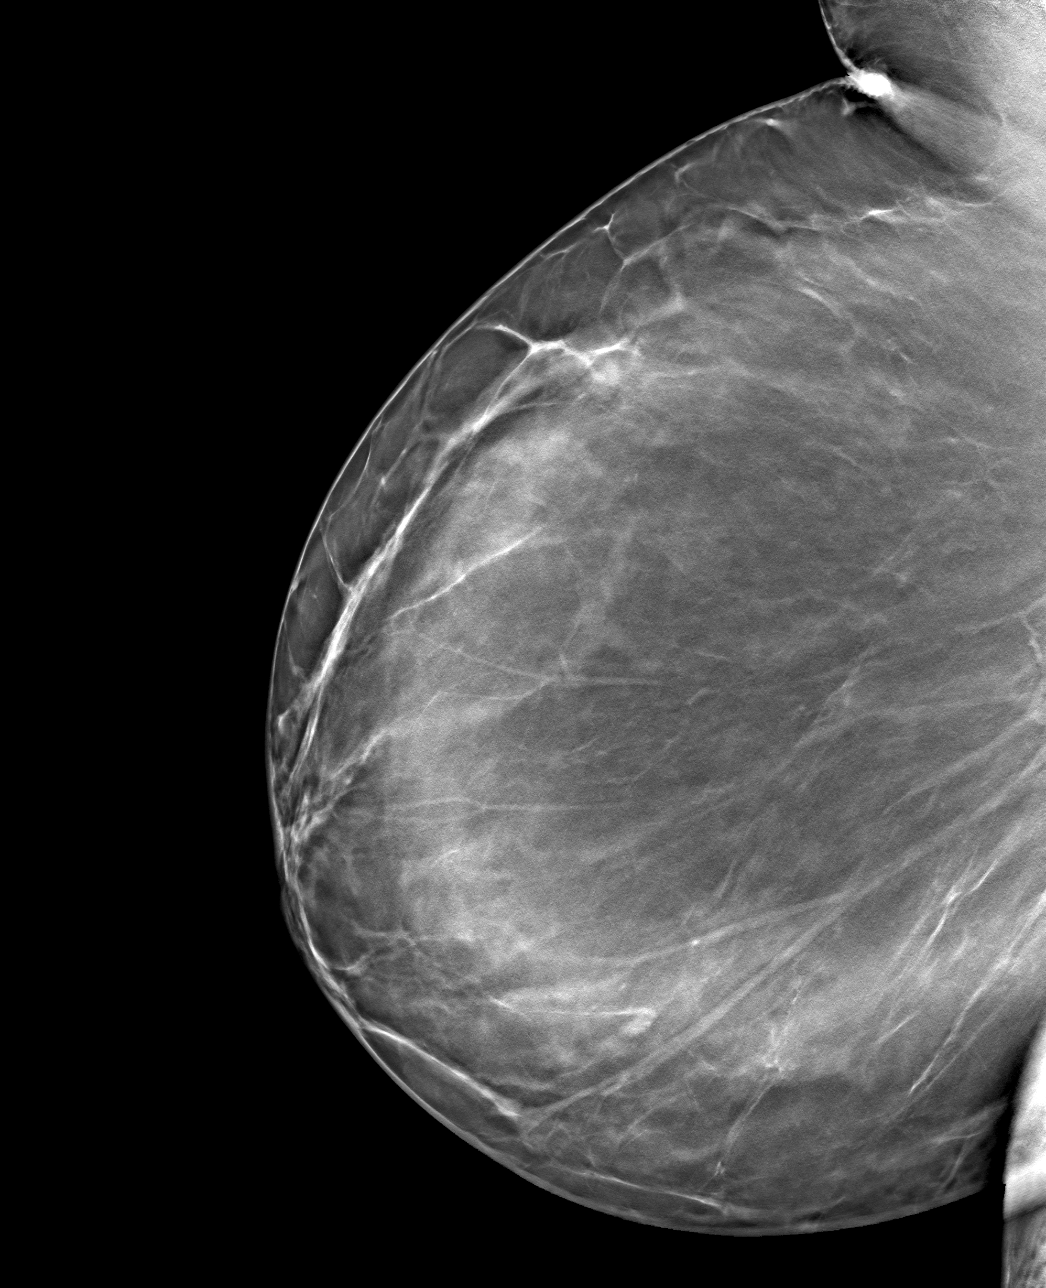

[R CC tomo · tomo slice 47/93.0]
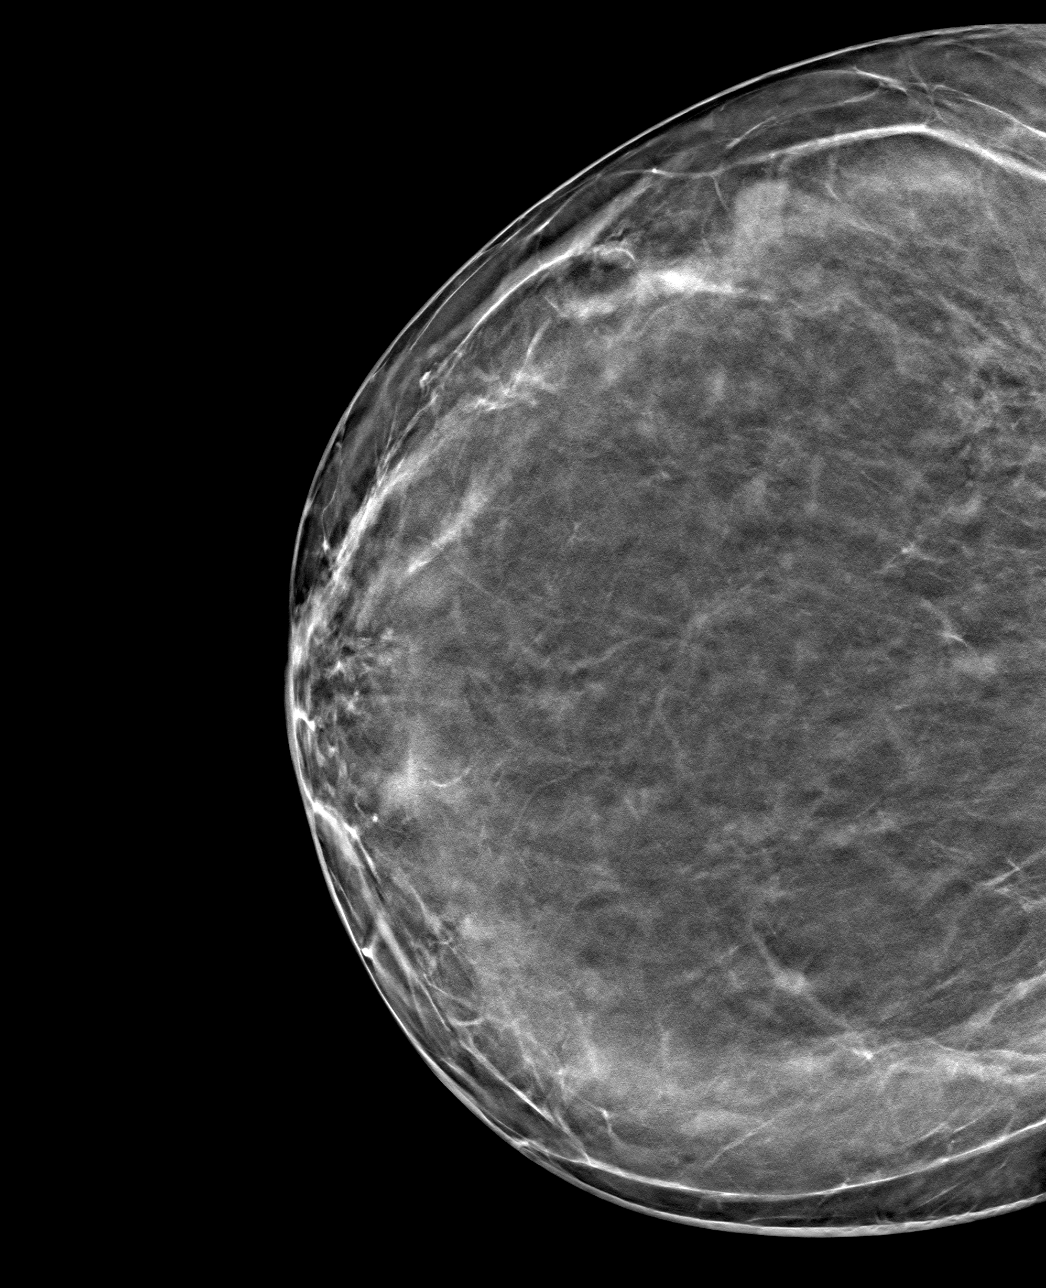

[4 of 12 positions shown; findings below may reference images not displayed]

ACR Breast Density Category b: There are scattered areas of
fibroglandular density.
FINDINGS: An oval circumscribed mass is confirmed within the outer RIGHT
breast, 9 o'clock axis region, measuring approximately 1.2 cm
greatest dimension, possible intramammary lymph node.

Mammographic images were processed with CAD.

Targeted ultrasound is performed, showing a benign morphologically
normal intramammary lymph node in the RIGHT breast at the 9 o'clock
axis, 11 cm from the nipple, measuring 9 mm in length, corresponding
to the mammographic finding. No suspicious solid or cystic mass is
identified within the outer RIGHT breast.
IMPRESSION: No evidence of malignancy. Benign intramammary lymph node within the
RIGHT breast at the 9 o'clock axis, corresponding to the
mammographic finding.

Patient may return to routine annual bilateral screening mammogram
schedule.

RECOMMENDATION:
Screening mammogram in one year.(Code:[id number])

I have discussed the findings and recommendations with the patient.
If applicable, a reminder letter will be sent to the patient
regarding the next appointment.

BI-RADS CATEGORY  2: Benign.

## 2020-07-19 ENCOUNTER — Other Ambulatory Visit: Payer: Self-pay

## 2020-07-19 DIAGNOSIS — B20 Human immunodeficiency virus [HIV] disease: Secondary | ICD-10-CM

## 2020-07-19 MED ORDER — BIKTARVY 50-200-25 MG PO TABS: 1.0000 | ORAL_TABLET | Freq: Every day | ORAL | 0 refills | Status: DC

## 2020-07-20 ENCOUNTER — Telehealth: Payer: Self-pay

## 2020-07-20 ENCOUNTER — Encounter: Payer: Self-pay | Admitting: Internal Medicine

## 2020-07-20 NOTE — Telephone Encounter (Signed)
[facility] Patient Advocate Encounter  Completed and sent [employer] Advancing Access application for BIKTARVY for this patient who is uninsured.    Patient is approved 07/20/20 through 08/20/20.  BIN      [id number] PCN    [id number] GRP    [id number] ID        [id number]   , CPhT Specialty Pharmacy Patient [facility] Phone: [phone] Fax:  [phone]

## 2020-07-22 ENCOUNTER — Other Ambulatory Visit: Payer: Self-pay

## 2020-07-22 DIAGNOSIS — B20 Human immunodeficiency virus [HIV] disease: Secondary | ICD-10-CM

## 2020-08-04 ENCOUNTER — Other Ambulatory Visit: Payer: Self-pay

## 2020-08-04 ENCOUNTER — Ambulatory Visit: Payer: Self-pay

## 2020-08-04 DIAGNOSIS — Z113 Encounter for screening for infections with a predominantly sexual mode of transmission: Secondary | ICD-10-CM

## 2020-08-04 DIAGNOSIS — Z79899 Other long term (current) drug therapy: Secondary | ICD-10-CM

## 2020-08-04 DIAGNOSIS — B20 Human immunodeficiency virus [HIV] disease: Secondary | ICD-10-CM

## 2020-08-04 NOTE — Addendum Note (Signed)
Addended by: Clista Bernhardt on: 08/04/2020 10:07 AM   Modules accepted: Orders

## 2020-08-05 LAB — URINE CYTOLOGY ANCILLARY ONLY
Chlamydia: NEGATIVE
Comment: NEGATIVE
Comment: NORMAL
Neisseria Gonorrhea: NEGATIVE

## 2020-08-05 LAB — T-HELPER CELL (CD4) - (RCID CLINIC ONLY)
CD4 % Helper T Cell: 25 % — ABNORMAL LOW (ref 33–65)
CD4 T Cell Abs: 707 /uL (ref 400–1790)

## 2020-08-09 LAB — CBC WITH DIFFERENTIAL/PLATELET
Absolute Monocytes: 867 cells/uL (ref 200–950)
Basophils Absolute: 61 cells/uL (ref 0–200)
Basophils Relative: 0.6 %
Eosinophils Absolute: 133 cells/uL (ref 15–500)
Eosinophils Relative: 1.3 %
HCT: 35.7 % (ref 35.0–45.0)
Hemoglobin: 11.5 g/dL — ABNORMAL LOW (ref 11.7–15.5)
Lymphs Abs: 3040 cells/uL (ref 850–3900)
MCH: 27.8 pg (ref 27.0–33.0)
MCHC: 32.2 g/dL (ref 32.0–36.0)
MCV: 86.4 fL (ref 80.0–100.0)
MPV: 10.5 fL (ref 7.5–12.5)
Monocytes Relative: 8.5 %
Neutro Abs: 6100 cells/uL (ref 1500–7800)
Neutrophils Relative %: 59.8 %
Platelets: 387 10*3/uL (ref 140–400)
RBC: 4.13 10*6/uL (ref 3.80–5.10)
RDW: 13.2 % (ref 11.0–15.0)
Total Lymphocyte: 29.8 %
WBC: 10.2 10*3/uL (ref 3.8–10.8)

## 2020-08-09 LAB — COMPLETE METABOLIC PANEL WITH GFR
AG Ratio: 1.1 (calc) (ref 1.0–2.5)
ALT: 17 U/L (ref 6–29)
AST: 18 U/L (ref 10–30)
Albumin: 3.8 g/dL (ref 3.6–5.1)
Alkaline phosphatase (APISO): 70 U/L (ref 31–125)
BUN: 11 mg/dL (ref 7–25)
CO2: 28 mmol/L (ref 20–32)
Calcium: 9.2 mg/dL (ref 8.6–10.2)
Chloride: 103 mmol/L (ref 98–110)
Creat: 0.78 mg/dL (ref 0.50–1.10)
GFR, Est African American: 107 mL/min/{1.73_m2} (ref >=60)
GFR, Est Non African American: 92 mL/min/{1.73_m2} (ref >=60)
Globulin: 3.6 g/dL (calc) (ref 1.9–3.7)
Glucose, Bld: 93 mg/dL (ref 65–99)
Potassium: 4.1 mmol/L (ref 3.5–5.3)
Sodium: 137 mmol/L (ref 135–146)
Total Bilirubin: 0.3 mg/dL (ref 0.2–1.2)
Total Protein: 7.4 g/dL (ref 6.1–8.1)

## 2020-08-09 LAB — LIPID PANEL
Cholesterol: 136 mg/dL (ref <200)
HDL: 42 mg/dL — ABNORMAL LOW (ref >=50)
LDL Cholesterol (Calc): 75 mg/dL (calc)
Non-HDL Cholesterol (Calc): 94 mg/dL (calc) (ref <130)
Total CHOL/HDL Ratio: 3.2 (calc) (ref <5.0)
Triglycerides: 100 mg/dL (ref <150)

## 2020-08-09 LAB — RPR: RPR Ser Ql: NONREACTIVE

## 2020-08-09 LAB — HIV-1 RNA QUANT-NO REFLEX-BLD
HIV 1 RNA Quant: <20 Copies/mL
HIV-1 RNA Quant, Log: <1.3 Log cps/mL

## 2020-08-18 ENCOUNTER — Other Ambulatory Visit: Payer: Self-pay

## 2020-08-18 ENCOUNTER — Encounter: Payer: Self-pay | Admitting: Internal Medicine

## 2020-08-18 ENCOUNTER — Ambulatory Visit: Payer: Self-pay | Admitting: Internal Medicine

## 2020-08-18 ENCOUNTER — Other Ambulatory Visit: Payer: Self-pay | Admitting: Internal Medicine

## 2020-08-18 VITALS — BP 129/76 | HR 85 | Temp 98.1°F | Wt 261.0 lb

## 2020-08-18 DIAGNOSIS — Z113 Encounter for screening for infections with a predominantly sexual mode of transmission: Secondary | ICD-10-CM

## 2020-08-18 DIAGNOSIS — N63 Unspecified lump in unspecified breast: Secondary | ICD-10-CM

## 2020-08-18 DIAGNOSIS — Z79899 Other long term (current) drug therapy: Secondary | ICD-10-CM

## 2020-08-18 DIAGNOSIS — R519 Headache, unspecified: Secondary | ICD-10-CM | POA: Insufficient documentation

## 2020-08-18 DIAGNOSIS — B20 Human immunodeficiency virus [HIV] disease: Secondary | ICD-10-CM

## 2020-08-18 DIAGNOSIS — G4700 Insomnia, unspecified: Secondary | ICD-10-CM | POA: Insufficient documentation

## 2020-08-18 DIAGNOSIS — Z23 Encounter for immunization: Secondary | ICD-10-CM

## 2020-08-18 NOTE — Assessment & Plan Note (Signed)
Lipid panel noted 

## 2020-08-18 NOTE — Assessment & Plan Note (Signed)
Screened negative 

## 2020-08-18 NOTE — Assessment & Plan Note (Signed)
Headaches are a new problem. She notices them more when waking from sleep though it does not wake her up.  She does feel better some when she gets up.  No other associated symptoms including n/v.   It may simply be sleep/stress related, tension headache but with the positional association, I advised scanning her head with a CT or MRI.  I voiced my concerns for this. She though declined this and will let me know if it persists and can pursue further evaluation then.

## 2020-08-18 NOTE — Progress Notes (Signed)
   Subjective:    Patient ID: Kristy White, female    DOB: April 15, 1976, 44 y.o.   MRN: [id number]  HPI She is here for yearly follow up of HIV She continues on Biktarvy and no missed doses.  CD4 good at 707, viral load < 20.  Creat, LFTs good. She had a pap smear and mammogram with ultrasound and no concerns overall.   She is having a lot of issues with stress and sleep and is chronically fatigued with it.  She also has had some headaches recently and does notice them when waking up from sleep.  She is going to take a supplement to help with stress.     Review of Systems  Constitutional: Positive for fatigue. Negative for activity change and unexpected weight change.  Skin: Negative for rash.  Neurological: Positive for headaches. Negative for dizziness, weakness and light-headedness.  Psychiatric/Behavioral: Positive for sleep disturbance.       Objective:   Physical Exam Constitutional:      Appearance: Normal appearance.  Eyes:     General: No scleral icterus. Cardiovascular:     Rate and Rhythm: Normal rate and regular rhythm.  Pulmonary:     Effort: Pulmonary effort is normal.  Neurological:     General: No focal deficit present.     Mental Status: She is alert.  Psychiatric:        Mood and Affect: Mood normal.        Judgment: Judgment normal.   SH: no tobacco       Assessment & Plan:

## 2020-08-18 NOTE — Assessment & Plan Note (Signed)
She continues to do very well with no concerns with her medication or compliance.   She can continue with yearly follow up.

## 2020-08-18 NOTE — Assessment & Plan Note (Signed)
unotrasound reassuring and just needs yearly mammogram follow up

## 2020-08-18 NOTE — Assessment & Plan Note (Signed)
I discussed sleep hygiene and seems to be most related to stress/anxiety.  She is not interested in counseling.  She will let me know if she continues to struggle with this.

## 2020-08-18 NOTE — Telephone Encounter (Signed)
Pt has appt today. Refills pending.

## 2020-11-05 ENCOUNTER — Other Ambulatory Visit: Payer: Self-pay | Admitting: Internal Medicine

## 2020-11-05 DIAGNOSIS — B20 Human immunodeficiency virus [HIV] disease: Secondary | ICD-10-CM

## 2021-01-28 ENCOUNTER — Encounter: Payer: Self-pay | Admitting: Internal Medicine

## 2021-08-10 ENCOUNTER — Other Ambulatory Visit: Payer: Self-pay

## 2021-08-11 ENCOUNTER — Other Ambulatory Visit: Payer: Self-pay | Admitting: Internal Medicine

## 2021-08-11 DIAGNOSIS — B20 Human immunodeficiency virus [HIV] disease: Secondary | ICD-10-CM

## 2021-08-24 ENCOUNTER — Encounter: Payer: Self-pay | Admitting: Internal Medicine

## 2021-09-06 ENCOUNTER — Telehealth: Payer: Self-pay

## 2021-09-06 NOTE — Telephone Encounter (Signed)
Called patient to get some appointments scheduled, no answer and voicemail was full.Kristy White

## 2021-09-09 ENCOUNTER — Encounter: Payer: Self-pay | Admitting: Internal Medicine

## 2021-09-09 ENCOUNTER — Other Ambulatory Visit: Payer: Self-pay | Admitting: Pharmacist

## 2021-09-09 ENCOUNTER — Ambulatory Visit: Payer: Self-pay

## 2021-09-09 ENCOUNTER — Ambulatory Visit (INDEPENDENT_AMBULATORY_CARE_PROVIDER_SITE_OTHER): Payer: Self-pay | Admitting: Internal Medicine

## 2021-09-09 ENCOUNTER — Other Ambulatory Visit: Payer: Self-pay

## 2021-09-09 VITALS — BP 130/85 | HR 67 | Temp 97.8°F | Resp 16 | Wt 256.8 lb

## 2021-09-09 DIAGNOSIS — Z79899 Other long term (current) drug therapy: Secondary | ICD-10-CM

## 2021-09-09 DIAGNOSIS — Z113 Encounter for screening for infections with a predominantly sexual mode of transmission: Secondary | ICD-10-CM

## 2021-09-09 DIAGNOSIS — Z23 Encounter for immunization: Secondary | ICD-10-CM | POA: Insufficient documentation

## 2021-09-09 DIAGNOSIS — B20 Human immunodeficiency virus [HIV] disease: Secondary | ICD-10-CM

## 2021-09-09 MED ORDER — BICTEGRAVIR-EMTRICITAB-TENOFOV 50-200-25 MG PO TABS: 1.0000 | ORAL_TABLET | Freq: Every day | ORAL | 0 refills | Status: AC

## 2021-09-09 MED ORDER — BIKTARVY 50-200-25 MG PO TABS: 1.0000 | ORAL_TABLET | Freq: Every day | ORAL | 11 refills | Status: DC

## 2021-09-09 NOTE — Assessment & Plan Note (Signed)
Will screen today 

## 2021-09-09 NOTE — Progress Notes (Signed)
Medication Samples have been provided to the patient.  Drug name: Biktarvy        Strength: 50/200/25 mg       Qty: 7 tablets (1 bottles) LOT: [id number]   Exp.Date: 10/24  Dosing instructions: Take one tablet by mouth once daily  The patient has been instructed regarding the correct time, dose, and frequency of taking this medication, including desired effects and most common side effects.   Lelan Cush, PharmD, CPP Clinical Pharmacist Practitioner Infectious Diseases Clinical Pharmacist [facility]  

## 2021-09-09 NOTE — Assessment & Plan Note (Signed)
She is doing well but unfortunately has been out after not making her appointments.  I will check her labs today and have her return in about February to recheck and then continue with follow up every 6 months.  She is in agreement with this plan.

## 2021-09-09 NOTE — Assessment & Plan Note (Signed)
Discussed the flu shot and given today Discussed a COVID booster as well and she would like to defer for now.

## 2021-09-09 NOTE — Progress Notes (Signed)
   Subjective:    Patient ID: Kristy White, female    DOB: Jun 12, 1976, 45 y.o.   MRN: [id number]  HPI She is here for yearly follow up for HIV She has been on Biktarvy and no missed doses until she ran out about 1 week ago.  She has missed 2 recent follow up visits and not sure if yearly follow up is good for her and now prefers twice yearly.  She otherwise has no complaints.    Review of Systems  Constitutional:  Negative for fatigue.  Gastrointestinal:  Negative for diarrhea and nausea.  Skin:  Negative for rash.      Objective:   Physical Exam Eyes:     General: No scleral icterus. Pulmonary:     Effort: Pulmonary effort is normal.  Skin:    Findings: No rash.  Neurological:     General: No focal deficit present.     Mental Status: She is alert.  Psychiatric:        Mood and Affect: Mood normal.   SH: no tobacco       Assessment & Plan:

## 2021-09-09 NOTE — Assessment & Plan Note (Signed)
Will check her lipid panel 

## 2021-09-12 LAB — RPR: RPR Ser Ql: NONREACTIVE

## 2021-09-12 LAB — COMPLETE METABOLIC PANEL WITH GFR
AG Ratio: 0.9 (calc) — ABNORMAL LOW (ref 1.0–2.5)
ALT: 16 U/L (ref 6–29)
AST: 16 U/L (ref 10–35)
Albumin: 3.6 g/dL (ref 3.6–5.1)
Alkaline phosphatase (APISO): 66 U/L (ref 31–125)
BUN: 9 mg/dL (ref 7–25)
CO2: 27 mmol/L (ref 20–32)
Calcium: 9.2 mg/dL (ref 8.6–10.2)
Chloride: 105 mmol/L (ref 98–110)
Creat: 0.58 mg/dL (ref 0.50–0.99)
Globulin: 3.8 g/dL (calc) — ABNORMAL HIGH (ref 1.9–3.7)
Glucose, Bld: 82 mg/dL (ref 65–99)
Potassium: 4.4 mmol/L (ref 3.5–5.3)
Sodium: 139 mmol/L (ref 135–146)
Total Bilirubin: 0.3 mg/dL (ref 0.2–1.2)
Total Protein: 7.4 g/dL (ref 6.1–8.1)
eGFR: 114 mL/min/{1.73_m2} (ref >=60)

## 2021-09-12 LAB — CBC WITH DIFFERENTIAL/PLATELET
Absolute Monocytes: 689 cells/uL (ref 200–950)
Basophils Absolute: 49 cells/uL (ref 0–200)
Basophils Relative: 0.6 %
Eosinophils Absolute: 170 cells/uL (ref 15–500)
Eosinophils Relative: 2.1 %
HCT: 35.6 % (ref 35.0–45.0)
Hemoglobin: 11.5 g/dL — ABNORMAL LOW (ref 11.7–15.5)
Lymphs Abs: 2260 cells/uL (ref 850–3900)
MCH: 28.1 pg (ref 27.0–33.0)
MCHC: 32.3 g/dL (ref 32.0–36.0)
MCV: 87 fL (ref 80.0–100.0)
MPV: 10.4 fL (ref 7.5–12.5)
Monocytes Relative: 8.5 %
Neutro Abs: 4933 cells/uL (ref 1500–7800)
Neutrophils Relative %: 60.9 %
Platelets: 361 10*3/uL (ref 140–400)
RBC: 4.09 10*6/uL (ref 3.80–5.10)
RDW: 13.5 % (ref 11.0–15.0)
Total Lymphocyte: 27.9 %
WBC: 8.1 10*3/uL (ref 3.8–10.8)

## 2021-09-12 LAB — HIV-1 RNA QUANT-NO REFLEX-BLD
HIV 1 RNA Quant: NOT DETECTED Copies/mL
HIV-1 RNA Quant, Log: NOT DETECTED Log cps/mL

## 2021-09-12 LAB — LIPID PANEL
Cholesterol: 153 mg/dL (ref <200)
HDL: 46 mg/dL — ABNORMAL LOW (ref >=50)
LDL Cholesterol (Calc): 86 mg/dL (calc)
Non-HDL Cholesterol (Calc): 107 mg/dL (calc) (ref <130)
Total CHOL/HDL Ratio: 3.3 (calc) (ref <5.0)
Triglycerides: 113 mg/dL (ref <150)

## 2021-09-12 LAB — T-HELPER CELLS (CD4) COUNT (NOT AT ARMC)
Absolute CD4: 641 cells/uL (ref 490–1740)
CD4 T Helper %: 27 % — ABNORMAL LOW (ref 30–61)
Total lymphocyte count: 2364 cells/uL (ref 850–3900)

## 2021-09-23 ENCOUNTER — Other Ambulatory Visit (HOSPITAL_COMMUNITY): Payer: Self-pay

## 2021-09-26 ENCOUNTER — Encounter: Payer: Self-pay | Admitting: Internal Medicine

## 2021-09-29 ENCOUNTER — Other Ambulatory Visit: Payer: Self-pay | Admitting: Pharmacist

## 2021-09-29 DIAGNOSIS — B20 Human immunodeficiency virus [HIV] disease: Secondary | ICD-10-CM

## 2021-09-29 MED ORDER — BIKTARVY 50-200-25 MG PO TABS: 1.0000 | ORAL_TABLET | Freq: Every day | ORAL | 0 refills | Status: AC

## 2021-09-29 NOTE — Progress Notes (Signed)
Medication Samples have been provided to the patient. ° °Drug name: Biktarvy        °Strength: 50/200/25 mg       °Qty: 7 tablets (1 bottle)   °LOT: [id number]   °Exp.Date: 07/10/2023 ° °Dosing instructions: Take one tablet by mouth once daily ° °The patient has been instructed regarding the correct time, dose, and frequency of taking this medication, including desired effects and most common side effects.  ° °Kristy White L. Kyisha Fowle, PharmD, BCIDP, AAHIVP, CPP °Clinical Pharmacist Practitioner °Infectious Diseases Clinical Pharmacist °[facility] °09/20/2020, 10:07 AM  °

## 2021-11-22 ENCOUNTER — Ambulatory Visit: Payer: Self-pay | Admitting: Internal Medicine

## 2021-11-22 ENCOUNTER — Ambulatory Visit: Payer: Self-pay

## 2021-12-14 ENCOUNTER — Telehealth: Payer: Self-pay

## 2021-12-14 ENCOUNTER — Ambulatory Visit: Payer: Self-pay

## 2021-12-14 ENCOUNTER — Ambulatory Visit: Payer: Self-pay | Admitting: Internal Medicine

## 2021-12-14 NOTE — Telephone Encounter (Signed)
Called patient to see if she would be able to make it to today's appointment, no answer. Left HIPAA compliant voicemail requesting callback.   Brek Reece D Zhane Bluitt, RN  

## 2022-03-03 ENCOUNTER — Other Ambulatory Visit: Payer: Self-pay

## 2022-03-03 ENCOUNTER — Ambulatory Visit: Payer: Self-pay

## 2022-03-03 ENCOUNTER — Other Ambulatory Visit (HOSPITAL_COMMUNITY): Payer: Self-pay

## 2022-03-03 ENCOUNTER — Encounter: Payer: Self-pay | Admitting: Internal Medicine

## 2022-03-03 ENCOUNTER — Ambulatory Visit (INDEPENDENT_AMBULATORY_CARE_PROVIDER_SITE_OTHER): Payer: Self-pay | Admitting: Internal Medicine

## 2022-03-03 VITALS — BP 123/80 | HR 81 | Temp 97.9°F | Ht 66.0 in | Wt 261.0 lb

## 2022-03-03 DIAGNOSIS — F320 Major depressive disorder, single episode, mild: Secondary | ICD-10-CM

## 2022-03-03 DIAGNOSIS — B20 Human immunodeficiency virus [HIV] disease: Secondary | ICD-10-CM

## 2022-03-03 NOTE — Assessment & Plan Note (Signed)
Counseling services offered.

## 2022-03-03 NOTE — Assessment & Plan Note (Signed)
Poorly controlled again but will get her signed back up and back on medication.   I will check her labs today and have her return in 2 months to recheck again to be sure no resistance.

## 2022-03-03 NOTE — Patient Instructions (Addendum)
Primary Care Providers:  [facility]: [phone] [address], [address], [place], [place]  [facility]: [phone] [address] [place], [place]  [facility]: [phone] [address], [place], [place]  [facility]: Multiple locations, see [email or web address]

## 2022-03-03 NOTE — Progress Notes (Signed)
   Subjective:    Patient ID: KHIYAH MABEN, female    DOB: Mar 30, 1976, 46 y.o.   MRN: [id number]  HPI Here for follow up of HIV She continues with [facility] and has had sporadic follow up but here again doing well.  She had been out of her medication last visit about 1 week but back on since.  She unfortunately is out again for about 5 days after again not renewing.  Her main issues is depression and insomnia related to working too much. 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  She has worked on reducing her workload but has not found a way to do that.  No other concerns.      Review of Systems  Constitutional:  Positive for fatigue.  Gastrointestinal:  Negative for diarrhea.  Skin:  Negative for rash.  Psychiatric/Behavioral:  Positive for sleep disturbance.       Objective:   Physical Exam Eyes:     General: No scleral icterus. Pulmonary:     Effort: Pulmonary effort is normal.  Skin:    Findings: No rash.  Neurological:     Mental Status: She is alert.   SH: no tobacco       Assessment & Plan:

## 2022-03-03 NOTE — Addendum Note (Signed)
Addended by: Harley Alto on: 03/03/2022 03:33 PM   Modules accepted: Orders

## 2022-03-09 ENCOUNTER — Other Ambulatory Visit: Payer: Self-pay | Admitting: Pharmacist

## 2022-03-09 DIAGNOSIS — B20 Human immunodeficiency virus [HIV] disease: Secondary | ICD-10-CM

## 2022-03-09 MED ORDER — BICTEGRAVIR-EMTRICITAB-TENOFOV 50-200-25 MG PO TABS: 1.0000 | ORAL_TABLET | Freq: Every day | ORAL | 0 refills | Status: AC

## 2022-03-09 NOTE — Progress Notes (Signed)
Medication Samples have been provided to the patient.  Drug name: Biktarvy        Strength: 50/200/25 mg       Qty: 14 tablets (2 bottles) LOT: [id number]   Exp.Date: 9/25  Dosing instructions: Take one tablet by mouth once daily  The patient has been instructed regarding the correct time, dose, and frequency of taking this medication, including desired effects and most common side effects.   Vincenzina Jagoda, PharmD, CPP Clinical Pharmacist Practitioner Infectious Diseases Clinical Pharmacist [facility]  

## 2022-03-10 ENCOUNTER — Encounter: Payer: Self-pay | Admitting: Internal Medicine

## 2022-03-10 ENCOUNTER — Other Ambulatory Visit: Payer: Self-pay

## 2022-03-10 DIAGNOSIS — B20 Human immunodeficiency virus [HIV] disease: Secondary | ICD-10-CM

## 2022-03-10 NOTE — Addendum Note (Signed)
Addended by: Harley Alto on: 03/10/2022 08:09 AM   Modules accepted: Orders

## 2022-03-13 LAB — HIV-1 RNA QUANT-NO REFLEX-BLD
HIV 1 RNA Quant: NOT DETECTED Copies/mL
HIV-1 RNA Quant, Log: NOT DETECTED Log cps/mL

## 2022-03-13 LAB — T-HELPER CELLS (CD4) COUNT (NOT AT ARMC)
Absolute CD4: 644 cells/uL (ref 490–1740)
CD4 T Helper %: 27 % — ABNORMAL LOW (ref 30–61)
Total lymphocyte count: 2424 cells/uL (ref 850–3900)

## 2022-03-20 ENCOUNTER — Telehealth: Payer: Self-pay

## 2022-03-20 NOTE — Telephone Encounter (Signed)
[facility] called to verify Biktarvy ok to fill. Advised yes patient seen in May and schedulked for August 23.

## 2022-05-10 ENCOUNTER — Ambulatory Visit: Payer: Self-pay | Admitting: Internal Medicine

## 2022-05-10 ENCOUNTER — Telehealth: Payer: Self-pay

## 2022-05-10 NOTE — Telephone Encounter (Signed)
Called patient to reschedule missed appointment. No answer. Left HIPAA-compliant voicemail requesting call back. Will also send MyChart message.  Glayds Insco E Daren Doswell, RN  

## 2022-05-30 ENCOUNTER — Ambulatory Visit: Payer: Self-pay | Admitting: Internal Medicine

## 2022-08-08 ENCOUNTER — Other Ambulatory Visit (HOSPITAL_COMMUNITY): Payer: Self-pay

## 2022-08-08 ENCOUNTER — Telehealth: Payer: Self-pay

## 2022-08-08 NOTE — Telephone Encounter (Signed)
[facility] Patient Advocate Encounter ? ?Insurance verification completed.   ? ?The patient is uninsured and will need patient assistance for medication. ? ?We can complete the application and will need to meet with the patient for signatures and income documentation. ? ?Jadarrius Maselli, CPhT ?Specialty Pharmacy Patient Advocate ?[facility] ?Phone: [phone] ?Fax:  [phone]  ?

## 2022-08-09 ENCOUNTER — Other Ambulatory Visit: Payer: Self-pay

## 2022-08-09 ENCOUNTER — Encounter: Payer: Self-pay | Admitting: Internal Medicine

## 2022-08-09 ENCOUNTER — Ambulatory Visit (INDEPENDENT_AMBULATORY_CARE_PROVIDER_SITE_OTHER): Payer: Self-pay | Admitting: Internal Medicine

## 2022-08-09 ENCOUNTER — Ambulatory Visit: Payer: Self-pay

## 2022-08-09 VITALS — BP 142/82 | HR 105 | Resp 16 | Ht 66.0 in | Wt 265.0 lb

## 2022-08-09 DIAGNOSIS — Z79899 Other long term (current) drug therapy: Secondary | ICD-10-CM

## 2022-08-09 DIAGNOSIS — Z23 Encounter for immunization: Secondary | ICD-10-CM

## 2022-08-09 DIAGNOSIS — B20 Human immunodeficiency virus [HIV] disease: Secondary | ICD-10-CM

## 2022-08-09 DIAGNOSIS — Z113 Encounter for screening for infections with a predominantly sexual mode of transmission: Secondary | ICD-10-CM

## 2022-08-10 ENCOUNTER — Encounter: Payer: Self-pay | Admitting: Internal Medicine

## 2022-08-10 LAB — URINE CYTOLOGY ANCILLARY ONLY
Chlamydia: NEGATIVE
Comment: NEGATIVE
Comment: NORMAL
Neisseria Gonorrhea: NEGATIVE

## 2022-08-10 LAB — T-HELPER CELL (CD4) - (RCID CLINIC ONLY)
CD4 % Helper T Cell: 27 % — ABNORMAL LOW (ref 33–65)
CD4 T Cell Abs: 611 /uL (ref 400–1790)

## 2022-08-10 NOTE — Progress Notes (Signed)
   Subjective:    Patient ID: Kristy White, female    DOB: Aug 14, 1976, 46 y.o.   MRN: [id number]  HPI  Kristy White is here for follow up of HIV She continues on Biktarvy with no missed doses.  No new issues since last visit.  No issues with getting or taking her medication.  Still working for her church and very busy.    Review of Systems  Constitutional:  Negative for fatigue.  Gastrointestinal:  Negative for diarrhea and nausea.  Skin:  Negative for rash.       Objective:   Physical Exam Eyes:     General: No scleral icterus. Pulmonary:     Effort: Pulmonary effort is normal.  Neurological:     General: No focal deficit present.     Mental Status: She is alert.   SH: no tobacco        Assessment & Plan:

## 2022-08-10 NOTE — Assessment & Plan Note (Signed)
Flu shot discussed and given today

## 2022-08-10 NOTE — Assessment & Plan Note (Signed)
She continues to do well on her regimen and knows to keep up with the renewals.   Will check her labs today and she will continue with follow up every 6 months.

## 2022-08-10 NOTE — Assessment & Plan Note (Signed)
Will screen.  Denies any recent sexual activity.   

## 2022-08-10 NOTE — Assessment & Plan Note (Signed)
Will check her lipid panel 

## 2022-08-11 LAB — COMPLETE METABOLIC PANEL WITH GFR
AG Ratio: 0.9 (calc) — ABNORMAL LOW (ref 1.0–2.5)
ALT: 18 U/L (ref 6–29)
AST: 17 U/L (ref 10–35)
Albumin: 3.8 g/dL (ref 3.6–5.1)
Alkaline phosphatase (APISO): 72 U/L (ref 31–125)
BUN: 9 mg/dL (ref 7–25)
CO2: 27 mmol/L (ref 20–32)
Calcium: 9.6 mg/dL (ref 8.6–10.2)
Chloride: 104 mmol/L (ref 98–110)
Creat: 0.67 mg/dL (ref 0.50–0.99)
Globulin: 4.1 g/dL (calc) — ABNORMAL HIGH (ref 1.9–3.7)
Glucose, Bld: 116 mg/dL — ABNORMAL HIGH (ref 65–99)
Potassium: 3.8 mmol/L (ref 3.5–5.3)
Sodium: 140 mmol/L (ref 135–146)
Total Bilirubin: 0.3 mg/dL (ref 0.2–1.2)
Total Protein: 7.9 g/dL (ref 6.1–8.1)
eGFR: 109 mL/min/{1.73_m2} (ref >=60)

## 2022-08-11 LAB — CBC WITH DIFFERENTIAL/PLATELET
Absolute Monocytes: 564 cells/uL (ref 200–950)
Basophils Absolute: 83 cells/uL (ref 0–200)
Basophils Relative: 1 %
Eosinophils Absolute: 149 cells/uL (ref 15–500)
Eosinophils Relative: 1.8 %
HCT: 34.1 % — ABNORMAL LOW (ref 35.0–45.0)
Hemoglobin: 11.4 g/dL — ABNORMAL LOW (ref 11.7–15.5)
Lymphs Abs: 2349 cells/uL (ref 850–3900)
MCH: 27.7 pg (ref 27.0–33.0)
MCHC: 33.4 g/dL (ref 32.0–36.0)
MCV: 83 fL (ref 80.0–100.0)
MPV: 10.3 fL (ref 7.5–12.5)
Monocytes Relative: 6.8 %
Neutro Abs: 5154 cells/uL (ref 1500–7800)
Neutrophils Relative %: 62.1 %
Platelets: 392 10*3/uL (ref 140–400)
RBC: 4.11 10*6/uL (ref 3.80–5.10)
RDW: 13.1 % (ref 11.0–15.0)
Total Lymphocyte: 28.3 %
WBC: 8.3 10*3/uL (ref 3.8–10.8)

## 2022-08-11 LAB — LIPID PANEL
Cholesterol: 158 mg/dL (ref <200)
HDL: 49 mg/dL — ABNORMAL LOW (ref >=50)
LDL Cholesterol (Calc): 82 mg/dL (calc)
Non-HDL Cholesterol (Calc): 109 mg/dL (calc) (ref <130)
Total CHOL/HDL Ratio: 3.2 (calc) (ref <5.0)
Triglycerides: 174 mg/dL — ABNORMAL HIGH (ref <150)

## 2022-08-11 LAB — HIV-1 RNA QUANT-NO REFLEX-BLD
HIV 1 RNA Quant: <20 Copies/mL — ABNORMAL HIGH
HIV-1 RNA Quant, Log: <1.3 Log cps/mL — ABNORMAL HIGH

## 2022-08-11 LAB — RPR: RPR Ser Ql: NONREACTIVE

## 2022-08-15 ENCOUNTER — Telehealth: Payer: Self-pay

## 2022-08-15 NOTE — Telephone Encounter (Signed)
[facility] called office today for medication clarification. Would like to confirm patient is only taking Biktarvy. Confirmed this with pharmacy tech.  Leatrice Jewels, RMA

## 2022-09-11 ENCOUNTER — Other Ambulatory Visit: Payer: Self-pay | Admitting: Internal Medicine

## 2022-09-11 DIAGNOSIS — B20 Human immunodeficiency virus [HIV] disease: Secondary | ICD-10-CM

## 2022-10-05 ENCOUNTER — Telehealth: Payer: Self-pay | Admitting: Internal Medicine

## 2022-10-05 NOTE — Telephone Encounter (Signed)
Called pt to advise cancelling appointment for renewal since state extended application, needs to be renewed in July.  Stated call could not be completed

## 2022-10-06 ENCOUNTER — Other Ambulatory Visit (HOSPITAL_COMMUNITY): Payer: Self-pay

## 2022-10-16 ENCOUNTER — Ambulatory Visit: Payer: No Typology Code available for payment source

## 2023-01-28 ENCOUNTER — Encounter (HOSPITAL_COMMUNITY): Payer: Self-pay

## 2023-01-28 ENCOUNTER — Ambulatory Visit (HOSPITAL_COMMUNITY)
Admission: EM | Admit: 2023-01-28 | Discharge: 2023-01-28 | Disposition: A | Discharge status: Home / Self Care | Payer: Medicaid Other | Attending: Emergency Medicine | Admitting: Emergency Medicine

## 2023-01-28 DIAGNOSIS — K5900 Constipation, unspecified: Secondary | ICD-10-CM | POA: Insufficient documentation

## 2023-01-28 DIAGNOSIS — R141 Gas pain: Secondary | ICD-10-CM | POA: Insufficient documentation

## 2023-01-28 LAB — POCT URINALYSIS DIP (MANUAL ENTRY)
Bilirubin, UA: NEGATIVE
Glucose, UA: NEGATIVE mg/dL
Ketones, POC UA: NEGATIVE mg/dL
Nitrite, UA: NEGATIVE
Protein Ur, POC: NEGATIVE mg/dL
Spec Grav, UA: >=1.03 — AB (ref 1.010–1.025)
Urobilinogen, UA: 0.2 E.U./dL
pH, UA: 5.5 (ref 5.0–8.0)

## 2023-01-28 MED ORDER — POLYETHYLENE GLYCOL 4500 POWD: 17.0000 g | 0 refills | Status: DC

## 2023-01-28 NOTE — ED Triage Notes (Signed)
"  About a week now, pain is upper right abdomen area, a week prior lower back was hurting, when breathing it hurts as well". Pain moves to right/left back, shoulder on right side". "Not Chest". Took some Dulcolax "for possible constipation and gas". No dysuria. No Urinary problems known. Stools "last bowel movement, about a week ago" (very gassy with constipation).

## 2023-01-28 NOTE — ED Provider Notes (Signed)
[facility]    CSN: [id number] Arrival date & time: 01/28/23  1103      History   Chief Complaint Chief Complaint  Patient presents with   Flank Pain    HPI Kristy White is a 47 y.o. female.  Here with 1 week history of right upper abdominal pain.  She feels this is gas pain.  She has not had a normal bowel movement in the last week.  Tried yesterday, had to strain and had little pebbles.  Denies any relationship with pain and eating.  Reports she does not drink much water, 1 bottle daily. No urinary symptoms.  Denies any fever, chills, flank pain, rash, nausea or vomiting She took Dulcolax last night but no other medicines  No recent travel, no known sick contacts  LMP 4/7  Past Medical History:  Diagnosis Date   HIV infection     Patient Active Problem List   Diagnosis Date Noted   Need for prophylactic vaccination and inoculation against influenza 09/09/2021   Insomnia 08/18/2020   Headache 08/18/2020   Amenorrhea, unspecified 07/28/2019   Breast lump in female 07/23/2019   Depression 06/19/2017   Encounter for long-term (current) use of medications 08/18/2014   Screening examination for venereal disease 08/18/2014   Irregular menstrual cycle 01/26/2014   Weight gain 01/26/2014   Abnormal Pap smear of cervix 03/06/2012   Human immunodeficiency virus (HIV) disease 11/15/2010    Past Surgical History:  Procedure Laterality Date   CESAREAN SECTION      OB History     Gravida  2   Para  1   Term  0   Preterm  1   AB  1   Living  1      SAB  0   IAB  1   Ectopic  0   Multiple  0   Live Births  1            Home Medications    Prior to Admission medications   Medication Sig Start Date End Date Taking? Authorizing Provider  bictegravir-emtricitabine-tenofovir AF (BIKTARVY) 50-200-25 MG TABS tablet TAKE 1 TABLET BY MOUTH DAILY 09/11/22  Yes Comer, Belia Heman, MD  Polyethylene Glycol 4500 POWD 17 g by Does not apply route  as directed. 01/28/23  Yes Juliet Vasbinder, Lurena Joiner, PA-C    Family History Family History  Problem Relation Age of Onset   Mental illness Mother     Social History Social History   Tobacco Use   Smoking status: Never   Smokeless tobacco: Never  Vaping Use   Vaping Use: Never used  Substance Use Topics   Alcohol use: No    Alcohol/week: 0.0 standard drinks of alcohol   Drug use: No     Allergies   Patient has no known allergies.   Review of Systems Review of Systems As per HPI  Physical Exam Triage Vital Signs ED Triage Vitals  Enc Vitals Group     BP 01/28/23 1219 (!) 146/89     Pulse Rate 01/28/23 1219 86     Resp 01/28/23 1219 20     Temp 01/28/23 1219 97.8 F (36.6 C)     Temp Source 01/28/23 1219 Oral     SpO2 01/28/23 1219 98 %     Weight 01/28/23 1216 265 lb 6.4 oz (120.4 kg)     Height 01/28/23 1216  (1.676 m)     Head Circumference --      Peak  Flow --      Pain Score 01/28/23 1215 9     Pain Loc --      Pain Edu? --      Excl. in GC? --    No data found.  Updated Vital Signs BP (!) 151/90 (BP Location: Right Arm)   Pulse 86   Temp 97.8 F (36.6 C) (Oral)   Resp 20   Ht 5\' 6"  (1.676 m)   Wt 265 lb 6.4 oz (120.4 kg)   LMP 01/14/2023   SpO2 98%   BMI 42.84 kg/m     Physical Exam Vitals and nursing note reviewed.  Constitutional:      General: She is not in acute distress.    Appearance: Normal appearance. She is not ill-appearing or diaphoretic.  HENT:     Mouth/Throat:     Mouth: Mucous membranes are moist.     Pharynx: Oropharynx is clear. No posterior oropharyngeal erythema.  Eyes:     Conjunctiva/sclera: Conjunctivae normal.  Cardiovascular:     Rate and Rhythm: Normal rate and regular rhythm.     Heart sounds: Normal heart sounds.  Pulmonary:     Effort: Pulmonary effort is normal. No respiratory distress.     Breath sounds: Normal breath sounds.  Abdominal:     General: Bowel sounds are normal.     Palpations: Abdomen is  soft. There is no mass.     Tenderness: There is no abdominal tenderness. There is no right CVA tenderness, left CVA tenderness, guarding or rebound. Negative signs include Murphy's sign.     Comments: [place] limits exam  Musculoskeletal:        General: Normal range of motion.  Skin:    General: Skin is warm and dry.  Neurological:     Mental Status: She is alert and oriented to person, place, and time.      UC Treatments / Results  Labs (all labs ordered are listed, but only abnormal results are displayed) Labs Reviewed  POCT URINALYSIS DIP (MANUAL ENTRY) - Abnormal; Notable for the following components:      Result Value   Color, UA straw (*)    Spec Grav, UA >=1.030 (*)    Blood, UA small (*)    Leukocytes, UA Trace (*)    All other components within normal limits  URINE CULTURE    EKG   Radiology No results found.  Procedures Procedures (including critical care time)  Medications Ordered in UC Medications - No data to display  Initial Impression / Assessment and Plan / UC Course  I have reviewed the triage vital signs and the nursing notes.  Pertinent labs & imaging results that were available during my care of the patient were reviewed by me and considered in my medical decision making (see chart for details).  UA with trace leuks and small red cells, elevated specific gravity.  Culture is pending, will treat if indicated. Advised to increase water intake to at least 64 ounces daily  Afebrile, no red flags on exam.  She is nontender on belly.  Her initial thought was this was gas pains.  This is possible given she has also been constipated.  Recommend MiraLAX cleanse out.  Discussed if this does not relieve her symptoms she should return for evaluation.  At any point anything gets worse she will go to the emergency department  Final Clinical Impressions(s) / UC Diagnoses   Final diagnoses:  Constipation, unspecified constipation type  Gas pain  Discharge Instructions      I recommend trying the MiraLAX cleanse out we discussed. Today, dissolve 1 capful of powder in 1 glass of water. Do this for 3 doses. (1 capful = 1 dose) This may help you to have a soft bowel movement within 6 to 12 hours. I recommend Monday and Tuesday morning using a dose dissolved in water as well. You can then continue as needed.  Increasing your water intake will help to keep stool soft  Please go to the emergency department if symptoms worsen    ED Prescriptions     Medication Sig Dispense Auth. Provider   Polyethylene Glycol 4500 POWD 17 g by Does not apply route as directed. 500 g Cambrea Kirt, Lurena Joiner, PA-C      PDMP not reviewed this encounter.   Naureen Benton, Lurena Joiner, [place] 01/28/23 1404

## 2023-01-28 NOTE — Discharge Instructions (Addendum)
I recommend trying the MiraLAX cleanse out we discussed. Today, dissolve 1 capful of powder in 1 glass of water. Do this for 3 doses. (1 capful = 1 dose) This may help you to have a soft bowel movement within 6 to 12 hours. I recommend Monday and Tuesday morning using a dose dissolved in water as well. You can then continue as needed.  Increasing your water intake will help to keep stool soft  Please go to the emergency department if symptoms worsen

## 2023-01-29 LAB — URINE CULTURE: Culture: 20000 — AB

## 2023-02-13 ENCOUNTER — Encounter: Payer: Self-pay | Admitting: Internal Medicine

## 2023-02-13 ENCOUNTER — Ambulatory Visit (INDEPENDENT_AMBULATORY_CARE_PROVIDER_SITE_OTHER): Payer: Self-pay | Admitting: Internal Medicine

## 2023-02-13 ENCOUNTER — Other Ambulatory Visit: Payer: Self-pay

## 2023-02-13 VITALS — BP 121/78 | HR 87 | Temp 97.9°F | Ht 66.0 in | Wt 262.0 lb

## 2023-02-13 DIAGNOSIS — B20 Human immunodeficiency virus [HIV] disease: Secondary | ICD-10-CM

## 2023-02-13 DIAGNOSIS — F439 Reaction to severe stress, unspecified: Secondary | ICD-10-CM

## 2023-02-13 DIAGNOSIS — N926 Irregular menstruation, unspecified: Secondary | ICD-10-CM

## 2023-02-13 LAB — CBC WITH DIFFERENTIAL/PLATELET
Basophils Absolute: 63 cells/uL (ref 0–200)
Eosinophils Relative: 1.5 %
HCT: 32.5 % — ABNORMAL LOW (ref 35.0–45.0)
MCHC: 32.9 g/dL (ref 32.0–36.0)

## 2023-02-13 NOTE — Progress Notes (Signed)
   Subjective:    Patient ID: Kristy White, female    DOB: July 01, 1976, 47 y.o.   MRN: [id number]  HPI  Kristy White is here for follow up of HIV She continues on Biktarvy with no issues getting or taking her medication.  Continues working at [employer] which continues to be stressful for her.  She reports continued irregular menses and had a prolonged mentruation of about 1 month recently.  She has always had irregular menses but not so prolonged.    Review of Systems  Constitutional:  Negative for fatigue.  Gastrointestinal:  Negative for diarrhea.  Skin:  Negative for rash.       Objective:   Physical Exam Eyes:     General: No scleral icterus. Pulmonary:     Effort: Pulmonary effort is normal.  Neurological:     General: No focal deficit present.     Mental Status: She is alert.   SH: no tobacco        Assessment & Plan:

## 2023-02-13 NOTE — Assessment & Plan Note (Signed)
Discussed ways of mitigating stress but ultimately is related to her job.

## 2023-02-13 NOTE — Assessment & Plan Note (Signed)
Prolonged menses recently which has been unusual for her.  Typically it has just been irregular.  I will check a cbc and refer her to gynecology

## 2023-02-13 NOTE — Assessment & Plan Note (Signed)
She continues to do well on [place] with no issues or concerns.  I encouraged continued good compliance and no indication for any changes.  Will check her labs to confirm and she can otherwise return in 6 months.

## 2023-02-14 LAB — T-HELPER CELL (CD4) - (RCID CLINIC ONLY)
CD4 % Helper T Cell: 30 % — ABNORMAL LOW (ref 33–65)
CD4 T Cell Abs: 633 /uL (ref 400–1790)

## 2023-02-16 LAB — CBC WITH DIFFERENTIAL/PLATELET
Absolute Monocytes: 735 cells/uL (ref 200–950)
Basophils Relative: 0.8 %
Eosinophils Absolute: 119 cells/uL (ref 15–500)
Hemoglobin: 10.7 g/dL — ABNORMAL LOW (ref 11.7–15.5)
Lymphs Abs: 2086 cells/uL (ref 850–3900)
MCH: 27.2 pg (ref 27.0–33.0)
MCV: 82.7 fL (ref 80.0–100.0)
MPV: 10.2 fL (ref 7.5–12.5)
Monocytes Relative: 9.3 %
Neutro Abs: 4898 cells/uL (ref 1500–7800)
Neutrophils Relative %: 62 %
Platelets: 371 10*3/uL (ref 140–400)
RBC: 3.93 10*6/uL (ref 3.80–5.10)
RDW: 13.5 % (ref 11.0–15.0)
Total Lymphocyte: 26.4 %
WBC: 7.9 10*3/uL (ref 3.8–10.8)

## 2023-02-16 LAB — HIV-1 RNA QUANT-NO REFLEX-BLD
HIV 1 RNA Quant: NOT DETECTED Copies/mL
HIV-1 RNA Quant, Log: NOT DETECTED Log cps/mL

## 2023-06-10 ENCOUNTER — Other Ambulatory Visit: Payer: Self-pay | Admitting: Internal Medicine

## 2023-06-10 DIAGNOSIS — B20 Human immunodeficiency virus [HIV] disease: Secondary | ICD-10-CM

## 2023-06-22 NOTE — Progress Notes (Signed)
The 10-year ASCVD risk score (Arnett DK, et al., 2019) is: 0.9%   Values used to calculate the score:     Age: 47 years     Sex: Female     Is Non-Hispanic African American: Yes     Diabetic: No     Tobacco smoker: No     Systolic Blood Pressure: 121 mmHg     Is BP treated: No     HDL Cholesterol: 49 mg/dL     Total Cholesterol: 158 mg/dL  Sandie Ano, RN

## 2023-08-21 ENCOUNTER — Other Ambulatory Visit: Payer: Self-pay

## 2023-08-21 ENCOUNTER — Encounter: Payer: Self-pay | Admitting: Internal Medicine

## 2023-08-21 ENCOUNTER — Ambulatory Visit (INDEPENDENT_AMBULATORY_CARE_PROVIDER_SITE_OTHER): Payer: Self-pay | Admitting: Internal Medicine

## 2023-08-21 ENCOUNTER — Ambulatory Visit: Payer: Self-pay

## 2023-08-21 VITALS — BP 137/87 | HR 77 | Temp 96.9°F | Resp 16

## 2023-08-21 DIAGNOSIS — Z113 Encounter for screening for infections with a predominantly sexual mode of transmission: Secondary | ICD-10-CM

## 2023-08-21 DIAGNOSIS — R635 Abnormal weight gain: Secondary | ICD-10-CM

## 2023-08-21 DIAGNOSIS — F32A Depression, unspecified: Secondary | ICD-10-CM

## 2023-08-21 DIAGNOSIS — B20 Human immunodeficiency virus [HIV] disease: Secondary | ICD-10-CM

## 2023-08-21 DIAGNOSIS — F320 Major depressive disorder, single episode, mild: Secondary | ICD-10-CM

## 2023-08-21 DIAGNOSIS — Z23 Encounter for immunization: Secondary | ICD-10-CM

## 2023-08-21 NOTE — Assessment & Plan Note (Signed)
Discussed vaccines and she has refused.

## 2023-08-21 NOTE — Assessment & Plan Note (Signed)
Will screen 

## 2023-08-21 NOTE — Assessment & Plan Note (Signed)
She is doing well, no issues and no changes indicated. Refills provided Previous labs reviewed    Follow up in 6 months.

## 2023-08-21 NOTE — Assessment & Plan Note (Signed)
Discussed options and encouraged her to consider the GLP1.  She will need medical clearance and she will drop it off for me to sign.

## 2023-08-21 NOTE — Assessment & Plan Note (Signed)
Mainly related to her job and poor sleep No SI Discussed need for sleep and she is contemplating changing jobs

## 2023-08-21 NOTE — Progress Notes (Signed)
   Subjective:    Patient ID: Kristy White, female    DOB: 20-Nov-1975, 47 y.o.   MRN: [id number]  HPI Kristy White is here for follow up of HIV She continues on Biktarvy with no missed doses.  No issues with getting or taking her medication. No new concerns.  She is interested in weight loss and discussing homeopathic treatments vs GLP1.   She continues to have stress at her job and has not yet decided to change.     Review of Systems  Constitutional:  Negative for fatigue.  Gastrointestinal:  Negative for diarrhea.  Skin:  Negative for rash.       Objective:   Physical Exam Eyes:     General: No scleral icterus. Pulmonary:     Effort: Pulmonary effort is normal.  Neurological:     Mental Status: She is alert.   SH: no tobacco        Assessment & Plan:

## 2023-08-22 LAB — T-HELPER CELL (CD4) - (RCID CLINIC ONLY)
CD4 % Helper T Cell: 26 % — ABNORMAL LOW (ref 33–65)
CD4 T Cell Abs: 508 /uL (ref 400–1790)

## 2023-08-23 LAB — CBC WITH DIFFERENTIAL/PLATELET
Absolute Lymphocytes: 2111 {cells}/uL (ref 850–3900)
Absolute Monocytes: 623 {cells}/uL (ref 200–950)
Basophils Absolute: 37 {cells}/uL (ref 0–200)
Basophils Relative: 0.4 %
Eosinophils Absolute: 149 {cells}/uL (ref 15–500)
Eosinophils Relative: 1.6 %
HCT: 34.2 % — ABNORMAL LOW (ref 35.0–45.0)
Hemoglobin: 10.8 g/dL — ABNORMAL LOW (ref 11.7–15.5)
MCH: 26.7 pg — ABNORMAL LOW (ref 27.0–33.0)
MCHC: 31.6 g/dL — ABNORMAL LOW (ref 32.0–36.0)
MCV: 84.7 fL (ref 80.0–100.0)
MPV: 10 fL (ref 7.5–12.5)
Monocytes Relative: 6.7 %
Neutro Abs: 6380 {cells}/uL (ref 1500–7800)
Neutrophils Relative %: 68.6 %
Platelets: 394 10*3/uL (ref 140–400)
RBC: 4.04 10*6/uL (ref 3.80–5.10)
RDW: 13.7 % (ref 11.0–15.0)
Total Lymphocyte: 22.7 %
WBC: 9.3 10*3/uL (ref 3.8–10.8)

## 2023-08-23 LAB — HIV-1 RNA QUANT-NO REFLEX-BLD
HIV 1 RNA Quant: NOT DETECTED {copies}/mL
HIV-1 RNA Quant, Log: NOT DETECTED {Log_copies}/mL

## 2023-08-23 LAB — URINE CYTOLOGY ANCILLARY ONLY
Chlamydia: NEGATIVE
Comment: NEGATIVE
Comment: NORMAL
Neisseria Gonorrhea: NEGATIVE

## 2023-08-23 LAB — COMPLETE METABOLIC PANEL WITH GFR
AG Ratio: 0.9 (calc) — ABNORMAL LOW (ref 1.0–2.5)
ALT: 11 U/L (ref 6–29)
AST: 13 U/L (ref 10–35)
Albumin: 3.6 g/dL (ref 3.6–5.1)
Alkaline phosphatase (APISO): 77 U/L (ref 31–125)
BUN: 9 mg/dL (ref 7–25)
CO2: 24 mmol/L (ref 20–32)
Calcium: 9.1 mg/dL (ref 8.6–10.2)
Chloride: 103 mmol/L (ref 98–110)
Creat: 0.7 mg/dL (ref 0.50–0.99)
Globulin: 3.9 g/dL — ABNORMAL HIGH (ref 1.9–3.7)
Glucose, Bld: 146 mg/dL — ABNORMAL HIGH (ref 65–99)
Potassium: 4.3 mmol/L (ref 3.5–5.3)
Sodium: 136 mmol/L (ref 135–146)
Total Bilirubin: 0.2 mg/dL (ref 0.2–1.2)
Total Protein: 7.5 g/dL (ref 6.1–8.1)
eGFR: 107 mL/min/{1.73_m2} (ref >=60)

## 2023-08-23 LAB — RPR: RPR Ser Ql: NONREACTIVE

## 2023-09-24 ENCOUNTER — Emergency Department (HOSPITAL_COMMUNITY)
Admission: EM | Admit: 2023-09-24 | Discharge: 2023-09-25 | Disposition: A | Discharge status: Home / Self Care | Payer: Self-pay | Attending: Emergency Medicine | Admitting: Emergency Medicine

## 2023-09-24 ENCOUNTER — Ambulatory Visit
Admission: EM | Admit: 2023-09-24 | Discharge: 2023-09-24 | Disposition: A | Discharge status: Home / Self Care | Payer: Self-pay | Attending: Family Medicine | Admitting: Family Medicine

## 2023-09-24 ENCOUNTER — Encounter (HOSPITAL_COMMUNITY): Payer: Self-pay

## 2023-09-24 ENCOUNTER — Other Ambulatory Visit: Payer: Self-pay

## 2023-09-24 DIAGNOSIS — Z21 Asymptomatic human immunodeficiency virus [HIV] infection status: Secondary | ICD-10-CM | POA: Insufficient documentation

## 2023-09-24 DIAGNOSIS — N939 Abnormal uterine and vaginal bleeding, unspecified: Secondary | ICD-10-CM

## 2023-09-24 LAB — CBC
HCT: 33.2 % — ABNORMAL LOW (ref 36.0–46.0)
Hemoglobin: 10.5 g/dL — ABNORMAL LOW (ref 12.0–15.0)
MCH: 26.7 pg (ref 26.0–34.0)
MCHC: 31.6 g/dL (ref 30.0–36.0)
MCV: 84.5 fL (ref 80.0–100.0)
Platelets: 362 10*3/uL (ref 150–400)
RBC: 3.93 MIL/uL (ref 3.87–5.11)
RDW: 14.6 % (ref 11.5–15.5)
WBC: 9 10*3/uL (ref 4.0–10.5)
nRBC: 0 % (ref 0.0–0.2)

## 2023-09-24 LAB — BASIC METABOLIC PANEL
Anion gap: 7 (ref 5–15)
BUN: 9 mg/dL (ref 6–20)
CO2: 26 mmol/L (ref 22–32)
Calcium: 8.8 mg/dL — ABNORMAL LOW (ref 8.9–10.3)
Chloride: 102 mmol/L (ref 98–111)
Creatinine, Ser: 0.66 mg/dL (ref 0.44–1.00)
GFR, Estimated: >60 mL/min (ref >60)
Glucose, Bld: 138 mg/dL — ABNORMAL HIGH (ref 70–99)
Potassium: 3.5 mmol/L (ref 3.5–5.1)
Sodium: 135 mmol/L (ref 135–145)

## 2023-09-24 LAB — HCG, SERUM, QUALITATIVE: Preg, Serum: NEGATIVE

## 2023-09-24 MED ORDER — MEGESTROL ACETATE 40 MG PO TABS: 80.0000 mg | ORAL_TABLET | Freq: Every day | ORAL | 0 refills | Status: DC

## 2023-09-24 NOTE — ED Triage Notes (Signed)
Vaginal bleeding for a year, pt states her bleeding is light and then heavy, but she has been bleeding nonstop for a year. Pt states she has had blood clots today. Denies dizziness

## 2023-09-24 NOTE — ED Provider Triage Note (Signed)
Emergency Medicine Provider Triage Evaluation Note  Kristy White , a 47 y.o. female  was evaluated in triage.  Pt complains of vaginal bleeding. States that she has been bleeding almost every day for the past year. Some days are heavier than others. Last 2 days have been heavy. Going through 1 pad every few hours. Denies pain. Went to urgent care and was prescribed megace, did not have labs drawn. Was told 'if you have more questions you can go to the ER.' Hx HIV on Biktarvy with reported compliance with undectable levels on labs recently. No fevers or chills. Not on any form of birth control. No shortness of breath, weakness, or fatigue.  Review of Systems  Positive:  Negative:   Physical Exam  BP (!) 152/102 (BP Location: Left Arm)   Pulse 88   Temp 98 F (36.7 C) (Oral)   Resp 18   Ht 5\' 6"  (1.676 m)   Wt 118.8 kg   SpO2 96%   BMI 42.27 kg/m  Gen:   Awake, no distress   Resp:  Normal effort  MSK:   Moves extremities without difficulty  Other:  Abdomen soft and nontender  Medical Decision Making  Medically screening exam initiated at 8:39 PM.  Appropriate orders placed.  Kristy White was informed that the remainder of the evaluation will be completed by another provider, this initial triage assessment does not replace that evaluation, and the importance of remaining in the ED until their evaluation is complete.     Vear Clock 09/24/23 2042

## 2023-09-24 NOTE — ED Provider Notes (Signed)
[facility]  Note:  This document was prepared using Conservation officer, historic buildings and may include unintentional dictation errors.  MRN: [id number] DOB: [birth year]  Subjective:   Kristy White is a 47 y.o. female presenting for acute on chronic vaginal bleeding today.  Patient reports several months of persistent intermittent heavy bleeding.  However, today she became concerned that she saw blood clots.  Has longstanding history of irregular vaginal bleeding.  Does not have a gynecologist.  No concern for an STI per patient.  No urinary symptoms.  No fever, nausea, vomiting, abdominal or pelvic pain.  No chest pain, shortness of breath.  No current facility-administered medications for this encounter.  Current Outpatient Medications:    BIKTARVY 50-200-25 MG TABS tablet, TAKE 1 TABLET BY MOUTH DAILY, Disp: 30 tablet, Rfl: 5   No Known Allergies  Past Medical History:  Diagnosis Date   HIV infection (HCC)      Past Surgical History:  Procedure Laterality Date   CESAREAN SECTION      Family History  Problem Relation Age of Onset   Mental illness Mother     Social History   Tobacco Use   Smoking status: Never   Smokeless tobacco: Never  Vaping Use   Vaping status: Never Used  Substance Use Topics   Alcohol use: No    Alcohol/week: 0.0 standard drinks of alcohol   Drug use: No    ROS   Objective:   Vitals: BP (!) 141/83 (BP Location: Right Arm)   Pulse 97   Temp 99 F (37.2 C) (Oral)   Resp 20   SpO2 95%   Physical Exam Constitutional:      General: She is not in acute distress.    Appearance: Normal appearance. She is well-developed. She is not ill-appearing, toxic-appearing or diaphoretic.  HENT:     Head: Normocephalic and atraumatic.     Nose: Nose normal.     Mouth/Throat:     Mouth: Mucous membranes are moist.     Pharynx: Oropharynx is clear.  Eyes:     General: No scleral icterus.       Right eye: No discharge.         Left eye: No discharge.     Extraocular Movements: Extraocular movements intact.     Conjunctiva/sclera: Conjunctivae normal.  Cardiovascular:     Rate and Rhythm: Normal rate.  Pulmonary:     Effort: Pulmonary effort is normal.  Abdominal:     General: Bowel sounds are normal. There is no distension.     Palpations: Abdomen is soft. There is no mass.     Tenderness: There is no abdominal tenderness. There is no right CVA tenderness, left CVA tenderness, guarding or rebound.  Skin:    General: Skin is warm and dry.  Neurological:     General: No focal deficit present.     Mental Status: She is alert and oriented to person, place, and time.  Psychiatric:        Mood and Affect: Mood normal.        Behavior: Behavior normal.        Thought Content: Thought content normal.        Judgment: Judgment normal.     Assessment and Plan :   PDMP not reviewed this encounter.  1. Abnormal vaginal bleeding    Declined STI testing.  Offered Megace for her irregular heavy vaginal bleeding.  Recommended she establish care soon as  possible with a gynecologist and provided her information for this.  Low suspicion for an emergent gynecologic process.  Counseled patient on potential for adverse effects with medications prescribed/recommended today, ER and return-to-clinic precautions discussed, patient verbalized understanding.    Wallis Bamberg, [place] 09/25/23 [phone]

## 2023-09-24 NOTE — ED Triage Notes (Signed)
Pt c/o vaginal bleeding x "several months-today more than usual blood clots"-NAD-steady gait

## 2023-09-25 NOTE — ED Provider Notes (Signed)
[place] EMERGENCY DEPARTMENT AT [facility] Provider Note   CSN: [id number] Arrival date & time: 09/24/23  2008     History  Chief Complaint  Patient presents with   Vaginal Bleeding    Kristy White is a 47 y.o. female.  The history is provided by the patient.  Vaginal Bleeding Kristy White is a 47 y.o. female who presents to the Emergency Department complaining of vaginal bleeding.  She presents to the emergency department for evaluation of vaginal bleeding that started about 8 to 10 months ago.  She states that she has bleeding most days.  Some days are lighter or heavier than others.  She states that yesterday she developed clots that were about the size of a pea, occasionally larger.  She uses about 3 pads a day.  She has not seen [facility].  She did go to urgent care earlier today and was started on Megace.  No fever, abdominal pain, hematochezia or melena, nausea, vomiting.  She has a history of HIV that is well-controlled on medications.  She is not sexually active.     Home Medications Prior to Admission medications   Medication Sig Start Date End Date Taking? Authorizing Provider  BIKTARVY 50-200-25 MG TABS tablet TAKE 1 TABLET BY MOUTH DAILY 06/12/23   Comer, Belia Heman, MD  megestrol (MEGACE) 40 MG tablet Take 2 tablets (80 mg total) by mouth daily. 09/24/23   Wallis Bamberg, PA-C      Allergies    Patient has no known allergies.    Review of Systems   Review of Systems  Genitourinary:  Positive for vaginal bleeding.  All other systems reviewed and are negative.   Physical Exam Updated Vital Signs BP (!) 156/96   Pulse 86   Temp 98.3 F (36.8 C) (Oral)   Resp 18   Ht 5\' 6"  (1.676 m)   Wt 118.8 kg   SpO2 95%   BMI 42.27 kg/m  Physical Exam Vitals and nursing note reviewed.  Constitutional:      Appearance: She is well-developed.  HENT:     Head: Normocephalic and atraumatic.  Cardiovascular:     Rate and Rhythm: Normal rate and regular  rhythm.  Pulmonary:     Effort: Pulmonary effort is normal. No respiratory distress.  Abdominal:     Palpations: Abdomen is soft.     Tenderness: There is no abdominal tenderness. There is no guarding or rebound.  Genitourinary:    Comments: No gross blood in the vaginal vault.  No blood on DRE. Musculoskeletal:        General: No tenderness.     Comments: Trace edema to BLE  Skin:    General: Skin is warm and dry.  Neurological:     Mental Status: She is alert and oriented to person, place, and time.  Psychiatric:        Behavior: Behavior normal.     ED Results / Procedures / Treatments   Labs (all labs ordered are listed, but only abnormal results are displayed) Labs Reviewed  CBC - Abnormal; Notable for the following components:      Result Value   Hemoglobin 10.5 (*)    HCT 33.2 (*)    All other components within normal limits  BASIC METABOLIC PANEL - Abnormal; Notable for the following components:   Glucose, Bld 138 (*)    Calcium 8.8 (*)    All other components within normal limits  HCG, SERUM, QUALITATIVE  EKG None  Radiology No results found.  Procedures Procedures    Medications Ordered in ED Medications - No data to display  ED Course/ Medical Decision Making/ A&P                                 Medical Decision Making Amount and/or Complexity of Data Reviewed Labs: ordered.   Patient here for evaluation of several months of vaginal bleeding.  Hemoglobin is stable.  No abdominal tenderness.  She does not have any blood on pelvic examination currently although she did have some blood externally.  No external source of bleeding noted.  Discussed with patient that she will need GYN follow-up for further evaluation of her vaginal bleeding.  No evidence of major hemorrhage at this time.  Return precautions discussed.        Final Clinical Impression(s) / ED Diagnoses Final diagnoses:  Vaginal bleeding    Rx / DC Orders ED Discharge  Orders     None         Tilden Fossa, MD 09/25/23 [phone]

## 2023-10-17 ENCOUNTER — Encounter: Payer: Self-pay | Admitting: Internal Medicine

## 2023-10-26 ENCOUNTER — Other Ambulatory Visit (HOSPITAL_COMMUNITY): Payer: Self-pay

## 2023-10-26 NOTE — Telephone Encounter (Addendum)
Patient called, she's been out of medication for 2 days. Notified her financial counselor is waiting on her check stubs to process ADAP application. She will try to make it by the office before we close today for samples.   Discussed that while missing doses is not ideal, that a few days of missing her medication should not negatively impact her health.   Sandie Ano, RN

## 2023-11-02 ENCOUNTER — Ambulatory Visit
Admission: EM | Admit: 2023-11-02 | Discharge: 2023-11-02 | Disposition: A | Discharge status: Home / Self Care | Payer: Self-pay | Attending: Family Medicine | Admitting: Family Medicine

## 2023-11-02 ENCOUNTER — Other Ambulatory Visit: Payer: Self-pay

## 2023-11-02 DIAGNOSIS — H5711 Ocular pain, right eye: Secondary | ICD-10-CM

## 2023-11-02 MED ORDER — MOXIFLOXACIN HCL 0.5 % OP SOLN: 1.0000 [drp] | Freq: Three times a day (TID) | OPHTHALMIC | 0 refills | Status: AC

## 2023-11-02 NOTE — ED Provider Notes (Signed)
[name]    CSN: [id number] Arrival date & time: 11/02/23  1250      History   Chief Complaint Chief Complaint  Patient presents with   Eye Pain    HPI Kristy White is a 48 y.o. female.   The history is provided by the patient.  Eye Pain Pertinent negatives include no headaches.  Right eye pain woke with symptoms today described as a throbbing or burning.  Admits change in vision has some blurred vision admits to flashing lights.  States eye was matted shut this morning has had some watery discharge and light sensitivity.  Contact lenses does not have her lens in the right eye.  Has had prior eye surgery for retinal detachment cannot recall which eye.  Denies recent injury, cold symptoms, known contacts with pinkeye. States has poor vision in right eye and not wearing her contact lens at this time,but feels her vision is worse than normal today  Past Medical History:  Diagnosis Date   HIV infection (HCC)     Patient Active Problem List   Diagnosis Date Noted   Stress 02/13/2023   Need for prophylactic vaccination and inoculation against influenza 09/09/2021   Insomnia 08/18/2020   Headache 08/18/2020   Amenorrhea, unspecified 07/28/2019   Breast lump in female 07/23/2019   Depression 06/19/2017   Encounter for long-term (current) use of medications 08/18/2014   Screening examination for venereal disease 08/18/2014   Irregular menstrual cycle 01/26/2014   Weight gain 01/26/2014   Abnormal Pap smear of cervix 03/06/2012   Human immunodeficiency virus (HIV) disease (HCC) 11/15/2010    Past Surgical History:  Procedure Laterality Date   CESAREAN SECTION      OB History     Gravida  2   Para  1   Term  0   Preterm  1   AB  1   Living  1      SAB  0   IAB  1   Ectopic  0   Multiple  0   Live Births  1            Home Medications    Prior to Admission medications   Medication Sig Start Date End Date Taking? Authorizing  Provider  BIKTARVY 50-200-25 MG TABS tablet TAKE 1 TABLET BY MOUTH DAILY 06/12/23   Comer, Belia Heman, MD  megestrol (MEGACE) 40 MG tablet Take 2 tablets (80 mg total) by mouth daily. 09/24/23   Wallis Bamberg, PA-C    Family History Family History  Problem Relation Age of Onset   Mental illness Mother     Social History Social History   Tobacco Use   Smoking status: Never   Smokeless tobacco: Never  Vaping Use   Vaping status: Never Used  Substance Use Topics   Alcohol use: No    Alcohol/week: 0.0 standard drinks of alcohol   Drug use: No     Allergies   Patient has no known allergies.   Review of Systems Review of Systems  HENT:  Negative for congestion and rhinorrhea.   Eyes:  Positive for photophobia, pain, discharge and visual disturbance. Negative for redness and itching.  Respiratory:  Negative for cough.   Neurological:  Negative for dizziness and headaches.     Physical Exam Triage Vital Signs ED Triage Vitals  Encounter Vitals Group     BP 11/02/23 1311 126/85     Systolic BP Percentile --      Diastolic  BP Percentile --      Pulse Rate 11/02/23 1311 75     Resp 11/02/23 1311 18     Temp 11/02/23 1311 97.9 F (36.6 C)     Temp Source 11/02/23 1311 Oral     SpO2 11/02/23 1311 97 %     Weight 11/02/23 1309 269 lb (122 kg)     Height 11/02/23 1309 5\' 6"  (1.676 m)     Head Circumference --      Peak Flow --      Pain Score 11/02/23 1309 8     Pain Loc --      Pain Education --      Exclude from Growth Chart --    No data found.  Updated Vital Signs BP 126/85 (BP Location: Right Arm)   Pulse 75   Temp 97.9 F (36.6 C) (Oral)   Resp 18   Ht 5\' 6"  (1.676 m)   Wt 269 lb (122 kg)   SpO2 97%   BMI 43.42 kg/m   Visual Acuity Right Eye Distance: 20/200 Left Eye Distance: 20/50 Bilateral Distance: 20/50 (pt reports wearing contacts daily-- although today, pt has contact in left eye but not in her right.)  Right Eye Near:   Left Eye Near:     Bilateral Near:     Physical Exam Vitals and nursing note reviewed.  Constitutional:      Appearance: She is not ill-appearing.  HENT:     Head: Normocephalic.  Eyes:     General: Lids are normal.        Right eye: Discharge present.     Extraocular Movements: Extraocular movements intact.     Conjunctiva/sclera: Conjunctivae normal.     Right eye: Right conjunctiva is not injected.     Left eye: Left conjunctiva is not injected.     Pupils: Pupils are equal, round, and reactive to light.     Funduscopic exam:    Right eye: No hemorrhage. Red reflex present.  Neurological:     Mental Status: She is alert.      UC Treatments / Results  Labs (all labs ordered are listed, but only abnormal results are displayed) Labs Reviewed - No data to display  EKG   Radiology No results found.  Procedures Procedures (including critical care time)  Medications Ordered in UC Medications - No data to display  Initial Impression / Assessment and Plan / UC Course  I have reviewed the triage vital signs and the nursing notes.  Pertinent labs & imaging results that were available during my care of the patient were reviewed by me and considered in my medical decision making (see chart for details).     48 year old contact reports eye pain, tearing, matting, change in vision flashing lights and some blurred and double vision.  Eye is not injected, pupils are reactive, no fluorescein dye uptake.  Recommend urgent evaluation by her eye doctor given history of retinal detachment and her report of flashing lights.  She is calling her eye doctor to see if they can see her today.  Her eye doctor will see her later today. She was instructed to keep that appointment and go to the ED for worsening symptoms or concerns  Final Clinical Impressions(s) / UC Diagnoses   Final diagnoses:  None   Discharge Instructions   None    ED Prescriptions   None    PDMP not reviewed this encounter.    Meliton Rattan, [place] 11/02/23 1358

## 2023-11-02 NOTE — ED Triage Notes (Signed)
Pt presents with right eye pain since waking up this morning, 1/24. Pt currently rates her pain an 8/10, describes as a throbbing sensation. Pt states the eye is draining. Visine drops applied this AM with no improvement. Pt wants to keep her right eye closed due to pain, states "I cannot see out of it."

## 2023-11-02 NOTE — Discharge Instructions (Addendum)
Your eye doctor today as discussed Go to the emergency department for worsening pain, worse vision, new symptoms or concerns

## 2023-12-26 ENCOUNTER — Encounter (HOSPITAL_BASED_OUTPATIENT_CLINIC_OR_DEPARTMENT_OTHER): Payer: Self-pay

## 2023-12-26 ENCOUNTER — Other Ambulatory Visit: Payer: Self-pay

## 2023-12-26 ENCOUNTER — Emergency Department (HOSPITAL_BASED_OUTPATIENT_CLINIC_OR_DEPARTMENT_OTHER)
Admission: EM | Admit: 2023-12-26 | Discharge: 2023-12-26 | Discharge status: Against Medical Advice | Payer: Self-pay | Attending: Emergency Medicine | Admitting: Emergency Medicine

## 2023-12-26 ENCOUNTER — Emergency Department (HOSPITAL_BASED_OUTPATIENT_CLINIC_OR_DEPARTMENT_OTHER): Payer: Self-pay | Admitting: Radiology

## 2023-12-26 DIAGNOSIS — R059 Cough, unspecified: Secondary | ICD-10-CM | POA: Insufficient documentation

## 2023-12-26 DIAGNOSIS — J029 Acute pharyngitis, unspecified: Secondary | ICD-10-CM | POA: Insufficient documentation

## 2023-12-26 DIAGNOSIS — R0981 Nasal congestion: Secondary | ICD-10-CM | POA: Insufficient documentation

## 2023-12-26 DIAGNOSIS — B974 Respiratory syncytial virus as the cause of diseases classified elsewhere: Secondary | ICD-10-CM | POA: Insufficient documentation

## 2023-12-26 DIAGNOSIS — Z5321 Procedure and treatment not carried out due to patient leaving prior to being seen by health care provider: Secondary | ICD-10-CM | POA: Insufficient documentation

## 2023-12-26 LAB — GROUP A STREP BY PCR: Group A Strep by PCR: NOT DETECTED

## 2023-12-26 LAB — RESP PANEL BY RT-PCR (RSV, FLU A&B, COVID)  RVPGX2
Influenza A by PCR: NEGATIVE
Influenza B by PCR: NEGATIVE
Resp Syncytial Virus by PCR: POSITIVE — AB
SARS Coronavirus 2 by RT PCR: NEGATIVE

## 2023-12-26 MED ORDER — ACETAMINOPHEN 325 MG PO TABS
650.0000 mg | ORAL_TABLET | Freq: Once | ORAL | Status: AC
  Administered 2023-12-26: 650 mg via ORAL
  Filled 2023-12-26: qty 2

## 2023-12-26 NOTE — ED Triage Notes (Signed)
 Pt reports she is here today due to flu like s/s. Pt reports productive cough , nasal congestion, sore throat x3 days. Pt reports taking OTC mucinex with no relief.

## 2024-01-11 ENCOUNTER — Other Ambulatory Visit (HOSPITAL_BASED_OUTPATIENT_CLINIC_OR_DEPARTMENT_OTHER): Payer: Self-pay | Admitting: Obstetrics and Gynecology

## 2024-01-11 DIAGNOSIS — Z1231 Encounter for screening mammogram for malignant neoplasm of breast: Secondary | ICD-10-CM

## 2024-01-14 ENCOUNTER — Ambulatory Visit: Admitting: Podiatry

## 2024-01-21 ENCOUNTER — Ambulatory Visit (HOSPITAL_BASED_OUTPATIENT_CLINIC_OR_DEPARTMENT_OTHER)
Admission: RE | Admit: 2024-01-21 | Discharge: 2024-01-21 | Disposition: A | Discharge status: Home / Self Care | Source: Ambulatory Visit | Attending: Internal Medicine | Admitting: Internal Medicine

## 2024-01-21 DIAGNOSIS — Z1231 Encounter for screening mammogram for malignant neoplasm of breast: Secondary | ICD-10-CM | POA: Diagnosis present

## 2024-01-24 ENCOUNTER — Encounter: Payer: Self-pay | Admitting: Obstetrics & Gynecology

## 2024-02-04 ENCOUNTER — Encounter: Payer: Self-pay | Admitting: Obstetrics & Gynecology

## 2024-02-04 ENCOUNTER — Other Ambulatory Visit (HOSPITAL_COMMUNITY)
Admission: RE | Admit: 2024-02-04 | Discharge: 2024-02-04 | Disposition: A | Discharge status: Home / Self Care | Source: Ambulatory Visit | Attending: Obstetrics & Gynecology | Admitting: Obstetrics & Gynecology

## 2024-02-04 ENCOUNTER — Ambulatory Visit: Admitting: Obstetrics & Gynecology

## 2024-02-04 ENCOUNTER — Encounter: Admitting: Podiatry

## 2024-02-04 VITALS — BP 132/83 | HR 89

## 2024-02-04 DIAGNOSIS — Z1339 Encounter for screening examination for other mental health and behavioral disorders: Secondary | ICD-10-CM | POA: Diagnosis not present

## 2024-02-04 DIAGNOSIS — B9689 Other specified bacterial agents as the cause of diseases classified elsewhere: Secondary | ICD-10-CM

## 2024-02-04 DIAGNOSIS — N939 Abnormal uterine and vaginal bleeding, unspecified: Secondary | ICD-10-CM | POA: Diagnosis present

## 2024-02-04 DIAGNOSIS — N760 Acute vaginitis: Secondary | ICD-10-CM

## 2024-02-04 DIAGNOSIS — D500 Iron deficiency anemia secondary to blood loss (chronic): Secondary | ICD-10-CM

## 2024-02-04 MED ORDER — MEGESTROL ACETATE 40 MG PO TABS: 40.0000 mg | ORAL_TABLET | Freq: Two times a day (BID) | ORAL | 5 refills | Status: DC

## 2024-02-04 NOTE — Progress Notes (Signed)
Patient did not show for scheduled appointment today.

## 2024-02-04 NOTE — Progress Notes (Unsigned)
 GYNECOLOGY OFFICE VISIT NOTE  History:   Kristy White is a 49 y.o. [phone] here today for evaluation of abnormal uterine bleeding for several years. Reports having menarche at 81 followed by having irregular periods for a few months,  but became regular.  Regular menses from age [age].  Then age 63, periods disappeared for years, came back one year ago, and was regular.  Then 7-8 months ago, it came twice a month,  then three times a month  and then she was bleeding almost every day.  Used hormone tea from online (unknown ingredients) and this slowed it down. Associated with mild pain, that occurred for one day.   Currently has spotting and brown discharge.  Denies any pelvic pain or other concerns. Has not been sexually active for several years.    Past Medical History:  Diagnosis Date   HIV infection [facility])     Past Surgical History:  Procedure Laterality Date   CESAREAN SECTION     The following portions of the patient's history were reviewed and updated as appropriate: allergies, current medications, past family history, past medical history, past social history, past surgical history and problem list.   Health Maintenance:  Normal pap and negative HRHPV on 07/28/2019.  Normal mammogram on 01/21/2024.   Review of Systems:  Pertinent items noted in HPI and remainder of comprehensive ROS otherwise negative.  Physical Exam:  BP 132/83   Pulse 89  CONSTITUTIONAL: Well-developed, well-nourished female in no acute distress.  HEENT:  Normocephalic, atraumatic. External right and left ear normal. No scleral icterus.  NECK: Normal range of motion, supple, no masses noted on observation SKIN: No rash noted. Not diaphoretic. No erythema. No pallor. MUSCULOSKELETAL: Normal range of motion. No edema noted. NEUROLOGIC: Alert and oriented to person, place, and time. Normal muscle tone coordination. No cranial nerve deficit noted on observation. PSYCHIATRIC: Normal mood and affect. Normal  behavior. Normal judgment and thought content. CARDIOVASCULAR: Normal heart rate noted RESPIRATORY: Effort and breath sounds normal, no problems with respiration noted ABDOMEN: No masses or other overt distention noted on observation. No tenderness.   PELVIC: Normal appearing external genitalia; normal urethral meatus; normal appearing vaginal mucosa and cervix.  Brown discharge noted.  Pap and discharge testing samples obtained.  Unable to palpate uterus or adnexa secondary to habitus. Performed in the presence of a chaperone     Assessment and Plan:     1. Abnormal uterine bleeding (AUB) (Primary) Patient has abnormal uterine bleeding . Will order abnormal uterine bleeding evaluation labs and pelvic ultrasound to evaluate for any structural gynecologic abnormalities.  Will contact patient with these results. Discussed possible need for endometrial biopsy for further evaluation.   Discussed management options, patient wants to try medication for now. Risks and benefits discussed. Megace  prescribed.  Bleeding precautions advised.  - Cytology - PAP - Cervicovaginal ancillary only - Follicle stimulating hormone - TSH Rfx on Abnormal to Free T4 - Testosterone,Free and Total - Estradiol - CBC - megestrol  (MEGACE ) 40 MG tablet; Take 1 tablet (40 mg total) by mouth 2 (two) times daily. Can increase to two tablets twice a day in the event of heavy bleeding  Dispense: 60 tablet; Refill: 5  Please refer to After Visit Summary for other counseling recommendations.   Return for follow up as recommended.    I spent 45 minutes dedicated to the care of this patient including pre-visit review of records, face to face time with the patient discussing her conditions and  treatments, post visit ordering of medications and appropriate tests or procedures, coordinating care and documenting this visit encounter.    Lenoard Rad, MD, FACOG Obstetrician & Gynecologist, [facility] for [employer], [facility]

## 2024-02-05 ENCOUNTER — Encounter: Payer: Self-pay | Admitting: Obstetrics & Gynecology

## 2024-02-05 LAB — TSH RFX ON ABNORMAL TO FREE T4: TSH: 1.46 u[IU]/mL (ref 0.450–4.500)

## 2024-02-06 ENCOUNTER — Encounter: Payer: Self-pay | Admitting: Obstetrics & Gynecology

## 2024-02-06 LAB — CBC
Hematocrit: 27.9 % — ABNORMAL LOW (ref 34.0–46.6)
Hemoglobin: 8.7 g/dL — ABNORMAL LOW (ref 11.1–15.9)
MCH: 25.2 pg — ABNORMAL LOW (ref 26.6–33.0)
MCHC: 31.2 g/dL — ABNORMAL LOW (ref 31.5–35.7)
MCV: 81 fL (ref 79–97)
Platelets: 473 10*3/uL — ABNORMAL HIGH (ref 150–450)
RBC: 3.45 x10E6/uL — ABNORMAL LOW (ref 3.77–5.28)
RDW: 14.5 % (ref 11.7–15.4)
WBC: 10.4 10*3/uL (ref 3.4–10.8)

## 2024-02-06 LAB — CERVICOVAGINAL ANCILLARY ONLY
Bacterial Vaginitis (gardnerella): POSITIVE — AB
Candida Glabrata: NEGATIVE
Candida Vaginitis: NEGATIVE
Chlamydia: NEGATIVE
Comment: NEGATIVE
Comment: NEGATIVE
Comment: NEGATIVE
Comment: NEGATIVE
Comment: NEGATIVE
Comment: NORMAL
Neisseria Gonorrhea: NEGATIVE
Trichomonas: NEGATIVE

## 2024-02-06 LAB — TESTOSTERONE,FREE AND TOTAL
Testosterone, Free: 0.9 pg/mL (ref 0.0–4.2)
Testosterone: 19 ng/dL (ref 4–50)

## 2024-02-06 LAB — ESTRADIOL: Estradiol: 20.8 pg/mL

## 2024-02-06 LAB — FOLLICLE STIMULATING HORMONE: FSH: 0.4 m[IU]/mL

## 2024-02-06 MED ORDER — METRONIDAZOLE 500 MG PO TABS: 500.0000 mg | ORAL_TABLET | Freq: Two times a day (BID) | ORAL | 0 refills | Status: AC

## 2024-02-06 MED ORDER — FERROUS SULFATE 324 (65 FE) MG PO TBEC: 1.0000 | DELAYED_RELEASE_TABLET | Freq: Every day | ORAL | 2 refills | Status: DC

## 2024-02-06 NOTE — Addendum Note (Signed)
 Addended by: Lenoard Rad A on: 02/06/2024 06:15 PM   Modules accepted: Orders

## 2024-02-08 LAB — CYTOLOGY - PAP
Comment: NEGATIVE
Diagnosis: NEGATIVE
Diagnosis: REACTIVE
High risk HPV: NEGATIVE

## 2024-02-11 ENCOUNTER — Encounter: Payer: Self-pay | Admitting: Obstetrics & Gynecology

## 2024-02-11 ENCOUNTER — Other Ambulatory Visit

## 2024-02-12 ENCOUNTER — Ambulatory Visit (HOSPITAL_BASED_OUTPATIENT_CLINIC_OR_DEPARTMENT_OTHER)
Admission: RE | Admit: 2024-02-12 | Discharge: 2024-02-12 | Disposition: A | Discharge status: Home / Self Care | Source: Ambulatory Visit | Attending: Obstetrics & Gynecology | Admitting: Obstetrics & Gynecology

## 2024-02-12 DIAGNOSIS — N939 Abnormal uterine and vaginal bleeding, unspecified: Secondary | ICD-10-CM | POA: Diagnosis present

## 2024-02-14 ENCOUNTER — Ambulatory Visit: Payer: Self-pay | Admitting: Internal Medicine

## 2024-02-14 ENCOUNTER — Telehealth: Payer: Self-pay | Admitting: *Deleted

## 2024-02-14 NOTE — Telephone Encounter (Signed)
-----   Message from [place] Anyanwu sent at 02/13/2024  8:54 AM EDT ----- 3 cm fibroid noted and small hydrosalpinx, both have likely been there for years.   Thickened endometrial stripe noted, endometrial biopsy is recommended for further evaluation of abnormal bleeding.  Please call to inform patient of results and recommendations.   Lenoard Rad, MD

## 2024-02-14 NOTE — Telephone Encounter (Signed)
 Pt informed of US  results and recommendation for a biopsy, appt made on 02/20/24

## 2024-02-20 ENCOUNTER — Ambulatory Visit: Payer: Self-pay | Admitting: Internal Medicine

## 2024-02-20 ENCOUNTER — Ambulatory Visit: Admitting: Obstetrics & Gynecology

## 2024-02-20 ENCOUNTER — Other Ambulatory Visit (HOSPITAL_COMMUNITY)
Admission: RE | Admit: 2024-02-20 | Discharge: 2024-02-20 | Disposition: A | Discharge status: Home / Self Care | Source: Ambulatory Visit | Attending: Obstetrics & Gynecology | Admitting: Obstetrics & Gynecology

## 2024-02-20 VITALS — BP 132/92 | HR 76 | Wt 272.0 lb

## 2024-02-20 DIAGNOSIS — R9389 Abnormal findings on diagnostic imaging of other specified body structures: Secondary | ICD-10-CM | POA: Insufficient documentation

## 2024-02-20 DIAGNOSIS — N939 Abnormal uterine and vaginal bleeding, unspecified: Secondary | ICD-10-CM | POA: Diagnosis not present

## 2024-02-20 DIAGNOSIS — C541 Malignant neoplasm of endometrium: Secondary | ICD-10-CM | POA: Diagnosis not present

## 2024-02-20 NOTE — Progress Notes (Signed)
      GYNECOLOGY OFFICE PROCEDURE NOTE   Kristy White is a 48 y.o. [phone] here for endometrial biopsy for abnormal uterine bleeding. Recent ultrasound also showed 27 mm endometrial thickness.  Today, she reports no concerning symptoms. Of note, pap on 02/04/24 was negative, negative HPV.   ENDOMETRIAL BIOPSY     The indications for endometrial biopsy were reviewed.   Risks of the biopsy including cramping, bleeding, infection, uterine perforation, inadequate specimen and need for additional procedures were discussed. Offered alternative of hysteroscopy, dilation and curettage in OR. The patient states she understands the R/B/I/A and agrees to undergo procedure today. Urine pregnancy test was negative. Consent was signed. Time out was performed. Chaperone was present during entire procedure.   Patient was positioned in dorsal lithotomy position. A vaginal speculum was placed.  The cervix was visualized and was prepped with Betadine.  The 3 mm pipelle was easily introduced into the endometrial cavity without difficulty to a depth of 10 cm, and a moderate amount of tissue was obtained after two passes and sent to pathology. The instruments were removed from the patient's vagina. Minimal bleeding from the cervix was noted. The patient tolerated the procedure well.   Patient was given post procedure instructions.  Will follow up pathology and manage accordingly; patient will be contacted with results and recommendations.  Routine preventative health maintenance measures emphasized.       Lenoard Rad, MD, FACOG Obstetrician & Gynecologist, [facility] for [employer], [facility]

## 2024-02-20 NOTE — Patient Instructions (Signed)
 ENDOMETRIAL BIOPSY POST-PROCEDURE INSTRUCTIONS  You may take Ibuprofen, Aleve or Tylenol  for pain if needed.  Cramping should resolve within  24 hours.  You may have a small amount of spotting.  You should wear a mini pad for the next few days.  You may have intercourse after 24 hours.  You need to call if you have any pelvic pain, fever, heavy bleeding or foul smelling vaginal discharge.  Shower or bathe as normal  6. We will call you within one week with results or we will discuss the results at your follow-up appointment if needed.

## 2024-02-21 ENCOUNTER — Ambulatory Visit: Payer: Self-pay | Admitting: Internal Medicine

## 2024-02-25 ENCOUNTER — Encounter: Payer: Self-pay | Admitting: Obstetrics & Gynecology

## 2024-02-25 ENCOUNTER — Ambulatory Visit: Payer: Self-pay | Admitting: Obstetrics & Gynecology

## 2024-02-25 DIAGNOSIS — C541 Malignant neoplasm of endometrium: Secondary | ICD-10-CM | POA: Insufficient documentation

## 2024-02-25 LAB — SURGICAL PATHOLOGY

## 2024-02-25 NOTE — Progress Notes (Signed)
 Results Addendum  02/20/2024 ENDOMETRIUM, BIOPSY:  - Endometrioid carcinoma, FIGO grade 1   Patient called and results discussed over the phone.  Support given to patient.  All questions answered.  GYN [facility] was called to make appointment, they recommended that a formal referral be sent,  so this was done.  Patient was told she would be contacted with appointment details.   Lenoard Rad, MD

## 2024-02-25 NOTE — Addendum Note (Signed)
 Addended by: Lenoard Rad A on: 02/25/2024 12:12 PM   Modules accepted: Orders

## 2024-02-25 NOTE — Progress Notes (Signed)
 The 10-year ASCVD risk score (Arnett DK, et al., 2019) is: 1.5%   Values used to calculate the score:     Age: 48 years     Sex: Female     Is Non-Hispanic African American: Yes     Diabetic: No     Tobacco smoker: No     Systolic Blood Pressure: 132 mmHg     Is BP treated: No     HDL Cholesterol: 49 mg/dL     Total Cholesterol: 158 mg/dL  Arlon Bergamo, BSN, RN

## 2024-02-26 ENCOUNTER — Telehealth: Payer: Self-pay

## 2024-02-26 NOTE — Telephone Encounter (Signed)
 Spoke with the patient regarding the referral to GYN oncology. Patient scheduled as new patient with Dr Daisey Dryer on 03/17/2024. Patient given an arrival time of 9:15am.  Explained to the patient the the doctor will perform a pelvic exam at this visit. Patient given the policy that only one visitor allowed and that visitor must be over 16 yrs are allowed in the Cancer Center. Patient given the address/phone number for the clinic and that the center offers free valet service. Patient aware that masks required.

## 2024-03-06 ENCOUNTER — Encounter: Admitting: Obstetrics and Gynecology

## 2024-03-17 ENCOUNTER — Inpatient Hospital Stay: Attending: Psychiatry | Admitting: Psychiatry

## 2024-03-17 ENCOUNTER — Inpatient Hospital Stay: Admitting: Gynecologic Oncology

## 2024-03-17 ENCOUNTER — Encounter: Payer: Self-pay | Admitting: Psychiatry

## 2024-03-17 ENCOUNTER — Other Ambulatory Visit: Payer: Self-pay | Admitting: Gynecologic Oncology

## 2024-03-17 ENCOUNTER — Other Ambulatory Visit: Payer: Self-pay | Admitting: *Deleted

## 2024-03-17 VITALS — BP 133/65 | HR 86 | Temp 98.1°F | Resp 17 | Ht 66.0 in | Wt 262.8 lb

## 2024-03-17 DIAGNOSIS — Z79899 Other long term (current) drug therapy: Secondary | ICD-10-CM | POA: Insufficient documentation

## 2024-03-17 DIAGNOSIS — G893 Neoplasm related pain (acute) (chronic): Secondary | ICD-10-CM | POA: Insufficient documentation

## 2024-03-17 DIAGNOSIS — R350 Frequency of micturition: Secondary | ICD-10-CM | POA: Insufficient documentation

## 2024-03-17 DIAGNOSIS — Z79818 Long term (current) use of other agents affecting estrogen receptors and estrogen levels: Secondary | ICD-10-CM | POA: Insufficient documentation

## 2024-03-17 DIAGNOSIS — N939 Abnormal uterine and vaginal bleeding, unspecified: Secondary | ICD-10-CM

## 2024-03-17 DIAGNOSIS — B20 Human immunodeficiency virus [HIV] disease: Secondary | ICD-10-CM | POA: Diagnosis not present

## 2024-03-17 DIAGNOSIS — Z6841 Body Mass Index (BMI) 40.0 and over, adult: Secondary | ICD-10-CM | POA: Insufficient documentation

## 2024-03-17 DIAGNOSIS — C541 Malignant neoplasm of endometrium: Secondary | ICD-10-CM

## 2024-03-17 MED ORDER — MEGESTROL ACETATE 40 MG PO TABS: 40.0000 mg | ORAL_TABLET | Freq: Two times a day (BID) | ORAL | 5 refills | Status: DC

## 2024-03-17 MED ORDER — SENNOSIDES-DOCUSATE SODIUM 8.6-50 MG PO TABS: 2.0000 | ORAL_TABLET | Freq: Every day | ORAL | 0 refills | Status: AC

## 2024-03-17 MED ORDER — TRAMADOL HCL 50 MG PO TABS: 50.0000 mg | ORAL_TABLET | Freq: Four times a day (QID) | ORAL | 0 refills | Status: AC | PRN

## 2024-03-17 NOTE — Addendum Note (Signed)
 Addended by: Vira Grieves D on: 03/17/2024 11:37 AM   Modules accepted: Orders

## 2024-03-17 NOTE — Progress Notes (Signed)
 Patient here for a consult with Dr. Daisey Dryer and for a pre-operative appointment prior to her scheduled surgery on 04/08/2024. She is scheduled for a robotic assisted total laparoscopic hysterectomy, bilateral salpingo-oophorectomy, sentinel lymph node biopsy, possible lymph node dissection, possible laparotomy. The surgery was discussed in detail.  See after visit summary for additional details.      Discussed post-op pain management in detail including the aspects of the enhanced recovery pathway.  Advised her that a new prescription would be sent in for Tramadol and it is only to be used for after her upcoming surgery.  We discussed the use of tylenol  post-op and to monitor for a maximum of 4,000 mg in a 24 hour period.  Also prescribed sennakot to be used after surgery and to hold if having loose stools.  Discussed bowel regimen in detail.     Discussed the use of SCDs and measures to take at home to prevent DVT including frequent mobility.  Reportable signs and symptoms of DVT discussed. Post-operative instructions discussed and expectations for after surgery. Incisional care discussed as well including reportable signs and symptoms including erythema, drainage, wound separation.     30 minutes spent with the patient.  Verbalizing understanding of material discussed. No needs or concerns voiced at the end of the visit.   Advised patient to call for any needs.  Advised that her post-operative medications had been prescribed and could be picked up at any time.    This appointment is included in the global surgical bundle as pre-operative teaching and has no charge.

## 2024-03-17 NOTE — H&P (View-Only) (Signed)
 GYNECOLOGIC ONCOLOGY NEW PATIENT CONSULTATION  Date of Service: 03/17/2024 Referring Provider: Julianne Octave, MD [address] [place],  [place] [address]   ASSESSMENT AND PLAN: Kristy White is a 48 y.o. woman with FIGO grade 1 endometrioid endometrial cancer.  We reviewed the nature of endometrial cancer and its recommended surgical staging, including total hysterectomy, bilateral salpingo-oophorectomy, and lymph node assessment. The patient is a suitable candidate for staging via a minimally invasive approach to surgery.  We reviewed that robotic assistance would be used to complete the surgery. We discussed that most endometrial cancer is detected early and that decisions regarding adjuvant therapy will be made based on her final pathology.   We reviewed the sentinel lymph node technique. Risks and benefits of sentinel lymph node biopsy was reviewed. We reviewed the technique and ICG dye. The patient DOES NOT have an iodine allergy or known liver dysfunction. We reviewed the false negative rate (0.4%), and that 3% of patients with metastatic disease will not have it detected by SLN biopsy in endometrial cancer. A low risk of allergic reaction to the dye, <0.2% for ICG, has been reported. We also discussed that in the case of failed mapping, which occurs 40% of the time, a bilateral or unilateral lymphadenectomy will be performed at the surgeon's discretion.   Potential benefits of sentinel nodes including a higher detection rate for metastasis due to ultrastaging and potential reduction in operative morbidity. However, there remains uncertainty as to the role for treatment of micrometastatic disease. Further, the benefit of operative morbidity associated with the SLN technique in endometrial cancer is not yet completely known. In other patient populations (e.g. the cervical cancer population) there has been observed reductions in morbidity with SLN biopsy compared to pelvic lymphadenectomy.  Lymphedema, nerve dysfunction and lymphocysts are all potential risks with the SLN technique as with complete lymphadenectomy. Additional risks to the patient include the risk of damage to an internal organ while operating in an altered view (e.g. the black and white image of the robotic fluorescence imaging mode).   Patient was consented for: Robotic assisted total laparoscopic hysterectomy, bilateral salpingo-oophorectomy, sentinel lymph node evaluation and biopsy, possible lymph node dissection on 04/08/24.  The risks of surgery were discussed in detail and she understands these to including but not limited to bleeding requiring a blood transfusion, infection, injury to adjacent organs (including but not limited to the bowels, bladder, ureters, nerves, blood vessels), thromboembolic events, wound separation, hernia, vaginal cuff separation, possible risk of lymphedema and lymphocyst if lymphadenectomy performed, and unforseen complication.  If the patient experiences any of these events, she understands that her hospitalization or recovery may be prolonged and that she may need to take additional medications for a prolonged period. The patient will receive DVT and antibiotic prophylaxis as indicated. She voiced a clear understanding. She had the opportunity to ask questions and informed consent was obtained today. She wishes to proceed.  Given symptoms for a year, will plan for preop CT abdomen/pelvis. Pt aware that if evidence of metastatic disease this may alter our treatment plan She does not require preoperative clearance. Her METs are >4.  All preoperative instructions were reviewed. Postoperative expectations were also reviewed. Written handouts were provided to the patient.  A copy of this note was sent to the patient's referring provider.  Kristy Flies, MD Gynecologic Oncology   Medical Decision Making I personally spent  TOTAL 55 minutes face-to-face and non-face-to-face in the care  of this patient, which includes all pre,  intra, and post visit time on the date of service.   ------------  CC: Endometrial cancer  HISTORY OF PRESENT ILLNESS:  Kristy White is a 48 y.o. woman who is seen in consultation at the request of Anyanwu, Kathrine Paris, MD for evaluation of endometrial cancer.  Patient presented to OB/GYN for abnormal uterine bleeding for several years.  She reported that her periods stopped sometime in early 40s, stopped for about 3 years or so, but returned 1 year ago.  She had a Pap smear collected at that time that returned NILM, HPV HR negative.  FSH was in premenopausal range.  She then underwent a pelvic ultrasound on 02/12/2024 which showed a thickened endometrium measuring 26.5 mm and a tubular structure in the right adnexa suggestive of a hydrosalpinx.  She returned on 02/20/2024 for endometrial biopsy which returned with FIGO grade 1 endometrioid carcinoma, p53 wild-type.  Today patient endorses the history above.  She reports her bleeding continues on and off.  She had been taking the Megace  40 mg twice daily but decided to increase it to 80 mg twice daily yesterday per the instructions on the bottle.  She is wearing pads and changes them out every few hours but is not soaking through them.  Reports crampy lower abdominal pain and increased urinary frequency since the biopsy was performed.  Otherwise denies abdominal bloating, early satiety, significant weight loss, change in bowel habits.   Is [facility] at [facility]. Next visit is with Dr. Zelda Hickman 03/20/24. Last viral load on 08/21/23 not detectable.  Takes her medication without issue.   PAST MEDICAL HISTORY: Past Medical History:  Diagnosis Date   Abnormal Pap smear of cervix 03/06/2012   HIV infection (HCC)     PAST SURGICAL HISTORY: Past Surgical History:  Procedure Laterality Date   CESAREAN SECTION      OB/GYN HISTORY: OB History  Gravida Para Term Preterm AB Living  2 1 0  1 1 1   SAB IAB Ectopic Multiple Live Births  0 1 0 0 1    # Outcome Date GA Lbr Len/2nd Weight Sex Type Anes PTL Lv  2 Preterm 11/12/99 [redacted]w[redacted]d  4 lb 11 oz (2.126 kg) F CS-LTranv   LIV     Birth Comments: C/S due to HIV  1 IAB               Age at menarche: 89 Age at menopause: periods stopped early 61s but then returned 1 year ago Hx of HRT: no Hx of STI: HIV, trichomonas Last pap: 02/04/24 NILM, HPV HR neg History of abnormal pap smears: LSIL in 2013, otherwise normal paps in system dating back to 2006. Denies any cervical procedures  SCREENING STUDIES:  Last mammogram: 01/2024 Last colonoscopy: none  MEDICATIONS:  Current Outpatient Medications:    BIKTARVY  50-200-25 MG TABS tablet, TAKE 1 TABLET BY MOUTH DAILY, Disp: 30 tablet, Rfl: 5   ferrous sulfate  324 (65 Fe) MG TBEC, Take 1 tablet (325 mg total) by mouth daily., Disp: 30 tablet, Rfl: 2   megestrol  (MEGACE ) 40 MG tablet, Take 1 tablet (40 mg total) by mouth 2 (two) times daily. Can increase to two tablets twice a day in the event of heavy bleeding, Disp: 60 tablet, Rfl: 5  ALLERGIES: No Known Allergies  FAMILY HISTORY: Family History  Problem Relation Age of Onset   Mental illness Mother    Breast cancer Neg Hx    Ovarian cancer Neg Hx  Colon cancer Neg Hx    Endometrial cancer Neg Hx     SOCIAL HISTORY: Social History   Socioeconomic History   Marital status: Single    Spouse name: Not on file   Number of children: 1   Years of education: Not on file   Highest education level: Associate degree: academic program  Occupational History   Not on file  Tobacco Use   Smoking status: Never   Smokeless tobacco: Never  Vaping Use   Vaping status: Never Used  Substance and Sexual Activity   Alcohol use: No    Alcohol/week: 0.0 standard drinks of alcohol   Drug use: No   Sexual activity: Not Currently    Comment: declined condoms  Other Topics Concern   Not on file  Social History Narrative   Not on file    Social Drivers of Health   Financial Resource Strain: Not on file  Food Insecurity: Not on file  Transportation Needs: No Transportation Needs (12/25/2019)   PRAPARE - Administrator, Civil Service (Medical): No    Lack of Transportation (Non-Medical): No  Physical Activity: Not on file  Stress: Not on file  Social Connections: Not on file  Intimate Partner Violence: Not on file    REVIEW OF SYSTEMS: New patient intake form was reviewed.  Complete 10-system review is negative except for the following: urinary frequency, vaginal bleeding  PHYSICAL EXAM: BP 133/65 (BP Location: Left Arm, Patient Position: Sitting)   Pulse 86   Temp 98.1 F (36.7 C) (Oral)   Resp 17   Ht 5\' 6"  (1.676 m)   Wt 262 lb 12.8 oz (119.2 kg)   SpO2 100%   BMI 42.42 kg/m  Constitutional: No acute distress. Neuro/Psych: Alert, oriented.  Head and Neck: Normocephalic, atraumatic. Neck symmetric without masses. Sclera anicteric.  Respiratory: Normal work of breathing. Clear to auscultation bilaterally. Cardiovascular: Regular rate and rhythm, no murmurs, rubs, or gallops. Abdomen: Normoactive bowel sounds. Soft, non-distended, non-tender to palpation. No masses appreciated. Well healed pfannenstiel incision. Extremities: Grossly normal range of motion. Warm, well perfused. No edema bilaterally. Skin: No rashes or lesions. Lymphatic: No cervical, supraclavicular, or inguinal adenopathy. Genitourinary: External genitalia without lesions. Urethral meatus without lesions or prolapse. On speculum exam, vagina and cervix without lesions, small blood in vaginal vault. Bimanual exam reveals normal cervix, mobile uterus, size difficult to appreciate due to body habitus. Exam chaperoned by Vira Grieves, NP   LABORATORY AND RADIOLOGIC DATA: Outside medical records were reviewed to synthesize the above history, along with the history and physical obtained during the visit.  Outside laboratory, pathology,  and imaging reports were reviewed, with pertinent results below.  I personally reviewed the outside images.  WBC  Date Value Ref Range Status  02/04/2024 10.4 3.4 - 10.8 x10E3/uL Final  09/24/2023 9.0 4.0 - 10.5 K/uL Final   Hemoglobin  Date Value Ref Range Status  02/04/2024 8.7 (L) 11.1 - 15.9 g/dL Final   Hematocrit  Date Value Ref Range Status  02/04/2024 27.9 (L) 34.0 - 46.6 % Final   Platelets  Date Value Ref Range Status  02/04/2024 473 (H) 150 - 450 x10E3/uL Final   Creat  Date Value Ref Range Status  08/21/2023 0.70 0.50 - 0.99 mg/dL Final   Creatinine, Ser  Date Value Ref Range Status  09/24/2023 0.66 0.44 - 1.00 mg/dL Final   AST  Date Value Ref Range Status  08/21/2023 13 10 - 35 U/L Final   ALT  Date Value Ref Range Status  08/21/2023 11 6 - 29 U/L Final   Diagnosis  Date Value Ref Range Status  02/04/2024   Final   - Negative for Intraepithelial Lesions or Malignancy (NILM)  02/04/2024 - Benign reactive/reparative changes  Final  07/28/2019   Final   - Negative for intraepithelial lesion or malignancy (NILM)   Surgical pathology (02/20/24): FINAL MICROSCOPIC DIAGNOSIS:   A. ENDOMETRIUM, BIOPSY:  - Endometrioid carcinoma, FIGO grade 1.  See comment.   COMMENT:  Immunohistochemical stain p53 is pending and will be reported in an  addendum.  This case was reviewed with Dr. Legolvan who agrees with the  above diagnosis.   ADDENDUM:  - Immunohistochemical stain for p53 shows wild-type staining.  Control  is adequate.    US  PELVIC COMPLETE WITH TRANSVAGINAL 02/12/2024  Narrative CLINICAL DATA:  Abnormal uterine bleeding for 7 months.  EXAM: TRANSABDOMINAL AND TRANSVAGINAL ULTRASOUND OF PELVIS  TECHNIQUE: Both transabdominal and transvaginal ultrasound examinations of the pelvis were performed. Transabdominal technique was performed for global imaging of the pelvis including uterus, ovaries, adnexal regions, and pelvic cul-de-sac. It was  necessary to proceed with endovaginal exam following the transabdominal exam to visualize the endometrium, uterus and ovaries.  COMPARISON:  None Available.  FINDINGS: Uterus  Measurements: 9.6 x 5.5 x 6.8 cm = volume: 185.5 mL. There is a 3 x 2.5 x 3 cm fundal right uterine fibroid.  Endometrium  Thickness: 26.64mm.  No focal abnormality visualized.  Right ovary  Measurements: 2.1 x 1.8 x 1 7 cm = volume: 3.3 mL. There is a tubular fluid-filled structure in the right adnexa suggesting hydrosalpinx.  Left ovary  Not visualized.  Other findings  No abnormal free fluid.  IMPRESSION: 1. 3 cm fundal right uterine fibroid. 2. Endometrium is thickened measuring 26.5 mm. If bleeding remains unresponsive to hormonal or medical therapy, focal lesion work-up with sonohysterogram should be considered. Endometrial biopsy should also be considered in pre-menopausal patients at high risk for endometrial carcinoma. (Ref: Radiological Reasoning: Algorithmic Workup of Abnormal Vaginal Bleeding with Endovaginal Sonography and Sonohysterography. AJR 2008; 161:W96-04) 3. Tubular fluid-filled structure in the right adnexa suggesting hydrosalpinx.   Electronically Signed By: Anna Barnes M.D. On: 02/12/2024 14:47

## 2024-03-17 NOTE — Patient Instructions (Addendum)
 Plan on having a CT scan prior to surgery. You will need to have nothing to eat or drink for 4 hours before your scan and arrive two hours early to begin drinking oral contrast.  Preparing for your Surgery  Plan for surgery on April 08, 2024  with Dr. Derrel Flies at [facility] You will be scheduled for robotic assisted total laparoscopic hysterectomy (removal of the uterus and cervix), bilateral salpingo-oophorectomy (removal of both ovaries and fallopian tubes), sentinel lymph node biopsy, possible lymph node dissection, possible laparotomy (larger incision on your abdomen if needed).  Pre-operative Testing -You will receive a phone call from presurgical testing at [facility] to arrange for a pre-operative appointment and lab work.  -Bring your insurance card, copy of an advanced directive if applicable, medication list  -At that visit, you will be asked to sign a consent for a possible blood transfusion in case a transfusion becomes necessary during surgery.  The need for a blood transfusion is rare but having consent is a necessary part of your care.     -You should not be taking blood thinners or aspirin at least ten days prior to surgery unless instructed by your surgeon.  -Do not take supplements such as fish oil (omega 3), red yeast rice, turmeric before your surgery. STOP TAKING AT LEAST 10 DAYS BEFORE SURGERY. You want to avoid medications with aspirin in them including headache powders such as BC or Goody's), Excedrin migraine.  Day Before Surgery at Home -You will be asked to take in a light diet the day before surgery. You will be advised you can have clear liquids up until 3 hours before your surgery.    Eat a light diet the day before surgery.  Examples including soups, broths, toast, yogurt, mashed potatoes.  AVOID GAS PRODUCING FOODS AND BEVERAGES. Things to avoid include carbonated beverages (fizzy beverages, sodas), raw fruits and raw vegetables  (uncooked), or beans.   If your bowels are filled with gas, your surgeon will have difficulty visualizing your pelvic organs which increases your surgical risks.  Your role in recovery Your role is to become active as soon as directed by your doctor, while still giving yourself time to heal.  Rest when you feel tired. You will be asked to do the following in order to speed your recovery:  - Cough and breathe deeply. This helps to clear and expand your lungs and can prevent pneumonia after surgery.  - STAY ACTIVE WHEN YOU GET HOME. Do mild physical activity. Walking or moving your legs help your circulation and body functions return to normal. Do not try to get up or walk alone the first time after surgery.   -If you develop swelling on one leg or the other, pain in the back of your leg, redness/warmth in one of your legs, please call the office or go to the Emergency Room to have a doppler to rule out a blood clot. For shortness of breath, chest pain-seek care in the Emergency Room as soon as possible. - Actively manage your pain. Managing your pain lets you move in comfort. We will ask you to rate your pain on a scale of zero to 10. It is your responsibility to tell your doctor or nurse where and how much you hurt so your pain can be treated.  Special Considerations -If you are diabetic, you may be placed on insulin after surgery to have closer control over your blood sugars to promote healing and recovery.  This does not mean that you will be discharged on insulin.  If applicable, your oral antidiabetics will be resumed when you are tolerating a solid diet.  -Your final pathology results from surgery should be available around one week after surgery and the results will be relayed to you when available.  -Dr. Abdul Hodgkin is the surgeon that assists your GYN Oncologist with surgery.  If you end up staying the night, the next day after your surgery you will either see Dr. Orvil Bland, Dr. Daisey Dryer,  or Dr. Abdul Hodgkin.  -FMLA forms can be faxed to [phone] and please allow 5-7 business days for completion.  Pain Management After Surgery -You will be prescribed your pain medication and bowel regimen medications before surgery so that you can have these available when you are discharged from the hospital. The pain medication is for use ONLY AFTER surgery and a new prescription will not be given.   -Make sure that you have Tylenol  and Ibuprofen IF YOU ARE ABLE TO TAKE THESE MEDICATIONS at home to use on a regular basis after surgery for pain control. We recommend alternating the medications every hour to six hours since they work differently and are processed in the body differently for pain relief.  -Review the attached handout on narcotic use and their risks and side effects.   Bowel Regimen -You will be prescribed Sennakot-S to take nightly to prevent constipation especially if you are taking the narcotic pain medication intermittently.  It is important to prevent constipation and drink adequate amounts of liquids. You can stop taking this medication when you are not taking pain medication and you are back on your normal bowel routine.  Risks of Surgery Risks of surgery are low but include bleeding, infection, damage to surrounding structures, re-operation, blood clots, and very rarely death.   Blood Transfusion Information (For the consent to be signed before surgery)  We will be checking your blood type before surgery so in case of emergencies, we will know what type of blood you would need.                                            WHAT IS A BLOOD TRANSFUSION?  A transfusion is the replacement of blood or some of its parts. Blood is made up of multiple cells which provide different functions. Red blood cells carry oxygen and are used for blood loss replacement. White blood cells fight against infection. Platelets control bleeding. Plasma helps clot blood. Other blood  products are available for specialized needs, such as hemophilia or other clotting disorders. BEFORE THE TRANSFUSION  Who gives blood for transfusions?  You may be able to donate blood to be used at a later date on yourself (autologous donation). Relatives can be asked to donate blood. This is generally not any safer than if you have received blood from a stranger. The same precautions are taken to ensure safety when a relative's blood is donated. Healthy volunteers who are fully evaluated to make sure their blood is safe. This is blood bank blood. Transfusion therapy is the safest it has ever been in the practice of medicine. Before blood is taken from a donor, a complete history is taken to make sure that person has no history of diseases nor engages in risky social behavior (examples are intravenous drug use or sexual activity with multiple partners). The donor's travel  history is screened to minimize risk of transmitting infections, such as malaria. The donated blood is tested for signs of infectious diseases, such as HIV and hepatitis. The blood is then tested to be sure it is compatible with you in order to minimize the chance of a transfusion reaction. If you or a relative donates blood, this is often done in anticipation of surgery and is not appropriate for emergency situations. It takes many days to process the donated blood. RISKS AND COMPLICATIONS Although transfusion therapy is very safe and saves many lives, the main dangers of transfusion include:  Getting an infectious disease. Developing a transfusion reaction. This is an allergic reaction to something in the blood you were given. Every precaution is taken to prevent this. The decision to have a blood transfusion has been considered carefully by your caregiver before blood is given. Blood is not given unless the benefits outweigh the risks.  AFTER SURGERY INSTRUCTIONS  Return to work: 4-6 weeks if applicable  Activity: 1. Be up and  out of the bed during the day.  Take a nap if needed.  You may walk up steps but be careful and use the hand rail.  Stair climbing will tire you more than you think, you may need to stop part way and rest.   2. No lifting or straining for 6 weeks over 10 pounds. No pushing, pulling, straining for 6 weeks.  3. No driving for 1-61 days when the following criteria have been met: Do not drive if you are taking narcotic pain medicine and make sure that your reaction time has returned.   4. You can shower as soon as the next day after surgery. Shower daily.  Use your regular soap and water (not directly on the incision) and pat your incision(s) dry afterwards; don't rub.  No tub baths or submerging your body in water until cleared by your surgeon. If you have the soap that was given to you by pre-surgical testing that was used before surgery, you do not need to use it afterwards because this can irritate your incisions.   5. No sexual activity and nothing in the vagina for 12 weeks.  6. You may experience a small amount of clear drainage from your incisions, which is normal.  If the drainage persists, increases, or changes color please call the office.  7. Do not use creams, lotions, or ointments such as neosporin on your incisions after surgery until advised by your surgeon because they can cause removal of the dermabond glue on your incisions.    8. You may experience vaginal spotting after surgery or when the stitches at the top of the vagina begin to dissolve.  The spotting is normal but if you experience heavy bleeding, call our office.  9. Take Tylenol  or ibuprofen first for pain if you are able to take these medications and only use narcotic pain medication for severe pain not relieved by the Tylenol  or Ibuprofen.  Monitor your Tylenol  intake to a max of 4,000 mg in a 24 hour period. You can alternate these medications after surgery.  Diet: 1. Low sodium Heart Healthy Diet is recommended but you  are cleared to resume your normal (before surgery) diet after your procedure.  2. It is safe to use a laxative, such as Miralax or Colace, if you have difficulty moving your bowels before surgery. You have been prescribed Sennakot-S to take at bedtime every evening after surgery to keep bowel movements regular and to prevent constipation.  Wound Care: 1. Keep clean and dry.  Shower daily.  Reasons to call the Doctor: Fever - Oral temperature greater than 100.4 degrees Fahrenheit Foul-smelling vaginal discharge Difficulty urinating Nausea and vomiting Increased pain at the site of the incision that is unrelieved with pain medicine. Difficulty breathing with or without chest pain New calf pain especially if only on one side Sudden, continuing increased vaginal bleeding with or without clots.   Contacts: For questions or concerns you should contact:  Dr. Daisey Dryer at [phone]  Vira Grieves, NP at [phone]  After Hours: call [phone] and have the GYN Oncologist paged/contacted (after 5 pm or on the weekends). You will speak with an after hours RN and let he or she know you have had surgery.  Messages sent via mychart are for non-urgent matters and are not responded to after hours so for urgent needs, please call the after hours number.

## 2024-03-17 NOTE — Progress Notes (Signed)
 GYNECOLOGIC ONCOLOGY NEW PATIENT CONSULTATION  Date of Service: 03/17/2024 Referring Provider: Julianne Octave, MD [address] [place],  [place] [address]   ASSESSMENT AND PLAN: Kristy White is a 48 y.o. woman with FIGO grade 1 endometrioid endometrial cancer.  We reviewed the nature of endometrial cancer and its recommended surgical staging, including total hysterectomy, bilateral salpingo-oophorectomy, and lymph node assessment. The patient is a suitable candidate for staging via a minimally invasive approach to surgery.  We reviewed that robotic assistance would be used to complete the surgery. We discussed that most endometrial cancer is detected early and that decisions regarding adjuvant therapy will be made based on her final pathology.   We reviewed the sentinel lymph node technique. Risks and benefits of sentinel lymph node biopsy was reviewed. We reviewed the technique and ICG dye. The patient DOES NOT have an iodine allergy or known liver dysfunction. We reviewed the false negative rate (0.4%), and that 3% of patients with metastatic disease will not have it detected by SLN biopsy in endometrial cancer. A low risk of allergic reaction to the dye, <0.2% for ICG, has been reported. We also discussed that in the case of failed mapping, which occurs 40% of the time, a bilateral or unilateral lymphadenectomy will be performed at the surgeon's discretion.   Potential benefits of sentinel nodes including a higher detection rate for metastasis due to ultrastaging and potential reduction in operative morbidity. However, there remains uncertainty as to the role for treatment of micrometastatic disease. Further, the benefit of operative morbidity associated with the SLN technique in endometrial cancer is not yet completely known. In other patient populations (e.g. the cervical cancer population) there has been observed reductions in morbidity with SLN biopsy compared to pelvic lymphadenectomy.  Lymphedema, nerve dysfunction and lymphocysts are all potential risks with the SLN technique as with complete lymphadenectomy. Additional risks to the patient include the risk of damage to an internal organ while operating in an altered view (e.g. the black and white image of the robotic fluorescence imaging mode).   Patient was consented for: Robotic assisted total laparoscopic hysterectomy, bilateral salpingo-oophorectomy, sentinel lymph node evaluation and biopsy, possible lymph node dissection on 04/08/24.  The risks of surgery were discussed in detail and she understands these to including but not limited to bleeding requiring a blood transfusion, infection, injury to adjacent organs (including but not limited to the bowels, bladder, ureters, nerves, blood vessels), thromboembolic events, wound separation, hernia, vaginal cuff separation, possible risk of lymphedema and lymphocyst if lymphadenectomy performed, and unforseen complication.  If the patient experiences any of these events, she understands that her hospitalization or recovery may be prolonged and that she may need to take additional medications for a prolonged period. The patient will receive DVT and antibiotic prophylaxis as indicated. She voiced a clear understanding. She had the opportunity to ask questions and informed consent was obtained today. She wishes to proceed.  Given symptoms for a year, will plan for preop CT abdomen/pelvis. Pt aware that if evidence of metastatic disease this may alter our treatment plan She does not require preoperative clearance. Her METs are >4.  All preoperative instructions were reviewed. Postoperative expectations were also reviewed. Written handouts were provided to the patient.  A copy of this note was sent to the patient's referring provider.  Derrel Flies, MD Gynecologic Oncology   Medical Decision Making I personally spent  TOTAL 55 minutes face-to-face and non-face-to-face in the care  of this patient, which includes all pre,  intra, and post visit time on the date of service.   ------------  CC: Endometrial cancer  HISTORY OF PRESENT ILLNESS:  Kristy White is a 48 y.o. woman who is seen in consultation at the request of Anyanwu, Kathrine Paris, MD for evaluation of endometrial cancer.  Patient presented to OB/GYN for abnormal uterine bleeding for several years.  She reported that her periods stopped sometime in early 40s, stopped for about 3 years or so, but returned 1 year ago.  She had a Pap smear collected at that time that returned NILM, HPV HR negative.  FSH was in premenopausal range.  She then underwent a pelvic ultrasound on 02/12/2024 which showed a thickened endometrium measuring 26.5 mm and a tubular structure in the right adnexa suggestive of a hydrosalpinx.  She returned on 02/20/2024 for endometrial biopsy which returned with FIGO grade 1 endometrioid carcinoma, p53 wild-type.  Today patient endorses the history above.  She reports her bleeding continues on and off.  She had been taking the Megace  40 mg twice daily but decided to increase it to 80 mg twice daily yesterday per the instructions on the bottle.  She is wearing pads and changes them out every few hours but is not soaking through them.  Reports crampy lower abdominal pain and increased urinary frequency since the biopsy was performed.  Otherwise denies abdominal bloating, early satiety, significant weight loss, change in bowel habits.   Is [facility] at [facility]. Next visit is with Dr. Zelda Hickman 03/20/24. Last viral load on 08/21/23 not detectable.  Takes her medication without issue.   PAST MEDICAL HISTORY: Past Medical History:  Diagnosis Date   Abnormal Pap smear of cervix 03/06/2012   HIV infection (HCC)     PAST SURGICAL HISTORY: Past Surgical History:  Procedure Laterality Date   CESAREAN SECTION      OB/GYN HISTORY: OB History  Gravida Para Term Preterm AB Living  2 1 0  1 1 1   SAB IAB Ectopic Multiple Live Births  0 1 0 0 1    # Outcome Date GA Lbr Len/2nd Weight Sex Type Anes PTL Lv  2 Preterm 11/12/99 [redacted]w[redacted]d  4 lb 11 oz (2.126 kg) F CS-LTranv   LIV     Birth Comments: C/S due to HIV  1 IAB               Age at menarche: 89 Age at menopause: periods stopped early 61s but then returned 1 year ago Hx of HRT: no Hx of STI: HIV, trichomonas Last pap: 02/04/24 NILM, HPV HR neg History of abnormal pap smears: LSIL in 2013, otherwise normal paps in system dating back to 2006. Denies any cervical procedures  SCREENING STUDIES:  Last mammogram: 01/2024 Last colonoscopy: none  MEDICATIONS:  Current Outpatient Medications:    BIKTARVY  50-200-25 MG TABS tablet, TAKE 1 TABLET BY MOUTH DAILY, Disp: 30 tablet, Rfl: 5   ferrous sulfate  324 (65 Fe) MG TBEC, Take 1 tablet (325 mg total) by mouth daily., Disp: 30 tablet, Rfl: 2   megestrol  (MEGACE ) 40 MG tablet, Take 1 tablet (40 mg total) by mouth 2 (two) times daily. Can increase to two tablets twice a day in the event of heavy bleeding, Disp: 60 tablet, Rfl: 5  ALLERGIES: No Known Allergies  FAMILY HISTORY: Family History  Problem Relation Age of Onset   Mental illness Mother    Breast cancer Neg Hx    Ovarian cancer Neg Hx  Colon cancer Neg Hx    Endometrial cancer Neg Hx     SOCIAL HISTORY: Social History   Socioeconomic History   Marital status: Single    Spouse name: Not on file   Number of children: 1   Years of education: Not on file   Highest education level: Associate degree: academic program  Occupational History   Not on file  Tobacco Use   Smoking status: Never   Smokeless tobacco: Never  Vaping Use   Vaping status: Never Used  Substance and Sexual Activity   Alcohol use: No    Alcohol/week: 0.0 standard drinks of alcohol   Drug use: No   Sexual activity: Not Currently    Comment: declined condoms  Other Topics Concern   Not on file  Social History Narrative   Not on file    Social Drivers of Health   Financial Resource Strain: Not on file  Food Insecurity: Not on file  Transportation Needs: No Transportation Needs (12/25/2019)   PRAPARE - Administrator, Civil Service (Medical): No    Lack of Transportation (Non-Medical): No  Physical Activity: Not on file  Stress: Not on file  Social Connections: Not on file  Intimate Partner Violence: Not on file    REVIEW OF SYSTEMS: New patient intake form was reviewed.  Complete 10-system review is negative except for the following: urinary frequency, vaginal bleeding  PHYSICAL EXAM: BP 133/65 (BP Location: Left Arm, Patient Position: Sitting)   Pulse 86   Temp 98.1 F (36.7 C) (Oral)   Resp 17   Ht 5\' 6"  (1.676 m)   Wt 262 lb 12.8 oz (119.2 kg)   SpO2 100%   BMI 42.42 kg/m  Constitutional: No acute distress. Neuro/Psych: Alert, oriented.  Head and Neck: Normocephalic, atraumatic. Neck symmetric without masses. Sclera anicteric.  Respiratory: Normal work of breathing. Clear to auscultation bilaterally. Cardiovascular: Regular rate and rhythm, no murmurs, rubs, or gallops. Abdomen: Normoactive bowel sounds. Soft, non-distended, non-tender to palpation. No masses appreciated. Well healed pfannenstiel incision. Extremities: Grossly normal range of motion. Warm, well perfused. No edema bilaterally. Skin: No rashes or lesions. Lymphatic: No cervical, supraclavicular, or inguinal adenopathy. Genitourinary: External genitalia without lesions. Urethral meatus without lesions or prolapse. On speculum exam, vagina and cervix without lesions, small blood in vaginal vault. Bimanual exam reveals normal cervix, mobile uterus, size difficult to appreciate due to body habitus. Exam chaperoned by Vira Grieves, NP   LABORATORY AND RADIOLOGIC DATA: Outside medical records were reviewed to synthesize the above history, along with the history and physical obtained during the visit.  Outside laboratory, pathology,  and imaging reports were reviewed, with pertinent results below.  I personally reviewed the outside images.  WBC  Date Value Ref Range Status  02/04/2024 10.4 3.4 - 10.8 x10E3/uL Final  09/24/2023 9.0 4.0 - 10.5 K/uL Final   Hemoglobin  Date Value Ref Range Status  02/04/2024 8.7 (L) 11.1 - 15.9 g/dL Final   Hematocrit  Date Value Ref Range Status  02/04/2024 27.9 (L) 34.0 - 46.6 % Final   Platelets  Date Value Ref Range Status  02/04/2024 473 (H) 150 - 450 x10E3/uL Final   Creat  Date Value Ref Range Status  08/21/2023 0.70 0.50 - 0.99 mg/dL Final   Creatinine, Ser  Date Value Ref Range Status  09/24/2023 0.66 0.44 - 1.00 mg/dL Final   AST  Date Value Ref Range Status  08/21/2023 13 10 - 35 U/L Final   ALT  Date Value Ref Range Status  08/21/2023 11 6 - 29 U/L Final   Diagnosis  Date Value Ref Range Status  02/04/2024   Final   - Negative for Intraepithelial Lesions or Malignancy (NILM)  02/04/2024 - Benign reactive/reparative changes  Final  07/28/2019   Final   - Negative for intraepithelial lesion or malignancy (NILM)   Surgical pathology (02/20/24): FINAL MICROSCOPIC DIAGNOSIS:   A. ENDOMETRIUM, BIOPSY:  - Endometrioid carcinoma, FIGO grade 1.  See comment.   COMMENT:  Immunohistochemical stain p53 is pending and will be reported in an  addendum.  This case was reviewed with Dr. Legolvan who agrees with the  above diagnosis.   ADDENDUM:  - Immunohistochemical stain for p53 shows wild-type staining.  Control  is adequate.    US  PELVIC COMPLETE WITH TRANSVAGINAL 02/12/2024  Narrative CLINICAL DATA:  Abnormal uterine bleeding for 7 months.  EXAM: TRANSABDOMINAL AND TRANSVAGINAL ULTRASOUND OF PELVIS  TECHNIQUE: Both transabdominal and transvaginal ultrasound examinations of the pelvis were performed. Transabdominal technique was performed for global imaging of the pelvis including uterus, ovaries, adnexal regions, and pelvic cul-de-sac. It was  necessary to proceed with endovaginal exam following the transabdominal exam to visualize the endometrium, uterus and ovaries.  COMPARISON:  None Available.  FINDINGS: Uterus  Measurements: 9.6 x 5.5 x 6.8 cm = volume: 185.5 mL. There is a 3 x 2.5 x 3 cm fundal right uterine fibroid.  Endometrium  Thickness: 26.64mm.  No focal abnormality visualized.  Right ovary  Measurements: 2.1 x 1.8 x 1 7 cm = volume: 3.3 mL. There is a tubular fluid-filled structure in the right adnexa suggesting hydrosalpinx.  Left ovary  Not visualized.  Other findings  No abnormal free fluid.  IMPRESSION: 1. 3 cm fundal right uterine fibroid. 2. Endometrium is thickened measuring 26.5 mm. If bleeding remains unresponsive to hormonal or medical therapy, focal lesion work-up with sonohysterogram should be considered. Endometrial biopsy should also be considered in pre-menopausal patients at high risk for endometrial carcinoma. (Ref: Radiological Reasoning: Algorithmic Workup of Abnormal Vaginal Bleeding with Endovaginal Sonography and Sonohysterography. AJR 2008; 161:W96-04) 3. Tubular fluid-filled structure in the right adnexa suggesting hydrosalpinx.   Electronically Signed By: Anna Barnes M.D. On: 02/12/2024 14:47

## 2024-03-17 NOTE — Progress Notes (Signed)
 On back order at [facility], will send to [facility]

## 2024-03-18 ENCOUNTER — Telehealth: Payer: Self-pay

## 2024-03-18 NOTE — Telephone Encounter (Signed)
 Per Laveda Ports (authorizations) the CT chest that is scheduled [employer] is out of network for [facility].  She can have the scan done at [facility] location. I called and left message for patient to call us  back to have this changed.

## 2024-03-20 ENCOUNTER — Ambulatory Visit: Payer: Self-pay | Admitting: Internal Medicine

## 2024-03-25 ENCOUNTER — Telehealth: Payer: Self-pay

## 2024-03-25 NOTE — Telephone Encounter (Signed)
 Per Vira Grieves NP, I Left a voicemail for Ms.Sypher to return our call,  regarding [facility] being out of network for CT scan and surgery.

## 2024-03-25 NOTE — Telephone Encounter (Signed)
-----   Message from Suellyn Emory sent at 03/25/2024 12:05 PM EDT ----- Please reach out to the patient and let her know her that the [facility] system is out of network for her surgery and her CT scan. Her insurance is saying [facility] is in network.  Looks like referring is Dr. Thurmon [place]. She can always reach out to their office for referral to [facility].   How would she like for us  to proceed?

## 2024-03-25 NOTE — Telephone Encounter (Signed)
 Ms.Manganello returned call regarding [facility] not being in network through her insurance. She states she would like to proceed with the CT scan and also the surgery here with Dr. Daisey Dryer.   Pt transferred to billing department per her request.   Vira Grieves NP aware of pt's decision.

## 2024-03-27 ENCOUNTER — Ambulatory Visit (HOSPITAL_COMMUNITY)
Admission: RE | Admit: 2024-03-27 | Discharge: 2024-03-27 | Disposition: A | Discharge status: Home / Self Care | Source: Ambulatory Visit | Attending: Psychiatry | Admitting: Psychiatry

## 2024-03-27 ENCOUNTER — Telehealth: Payer: Self-pay | Admitting: Licensed Clinical Social Worker

## 2024-03-27 ENCOUNTER — Ambulatory Visit (HOSPITAL_COMMUNITY)

## 2024-03-27 DIAGNOSIS — C541 Malignant neoplasm of endometrium: Secondary | ICD-10-CM | POA: Diagnosis present

## 2024-03-27 MED ORDER — IOHEXOL 9 MG/ML PO SOLN
ORAL | Status: AC
  Filled 2024-03-27: qty 1000

## 2024-03-27 MED ORDER — IOHEXOL 9 MG/ML PO SOLN: 1000.0000 mL | ORAL | Status: AC

## 2024-03-27 MED ORDER — IOHEXOL 300 MG/ML  SOLN
100.0000 mL | Freq: Once | INTRAMUSCULAR | Status: AC | PRN
  Administered 2024-03-27: 100 mL via INTRAVENOUS

## 2024-03-27 NOTE — Telephone Encounter (Signed)
 [facility] Clinical Social Work  Clinical Social Work was referred by new patient protocol for assessment of psychosocial needs.  Clinical Social Worker attempted to contact patient by phone to offer support and assess for needs.   No answer. Left VM with direct contact information and brief description of support services.     Leata Dominy E Malahki Gasaway, LCSW  Clinical Social Worker [facility]

## 2024-04-02 NOTE — Progress Notes (Signed)
 COVID Vaccine Completed:  Date of COVID positive in last 90 days:  PCP -  Cardiologist -  Infectious disease - Lamar Bucks, MD  Chest x-ray - 12-26-23 Epic EKG -  Stress Test -  ECHO -  Cardiac Cath -  Pacemaker/ICD device last checked: Spinal Cord Stimulator:  Bowel Prep -   Sleep Study -  CPAP -   Fasting Blood Sugar -  Checks Blood Sugar _____ times a day  Last dose of GLP1 agonist-  N/A GLP1 instructions:  Do not take after     Last dose of SGLT-2 inhibitors-  N/A SGLT-2 instructions:  Do not take after     Blood Thinner Instructions:  Last dose:   Time: Aspirin Instructions: Last Dose:  Activity level:  Can go up a flight of stairs and perform activities of daily living without stopping and without symptoms of chest pain or shortness of breath.  Able to exercise without symptoms  Unable to go up a flight of stairs without symptoms of     Anesthesia review:   Patient denies shortness of breath, fever, cough and chest pain at PAT appointment  Patient verbalized understanding of instructions that were given to them at the PAT appointment. Patient was also instructed that they will need to review over the PAT instructions again at home before surgery.

## 2024-04-02 NOTE — Patient Instructions (Addendum)
 SURGICAL WAITING ROOM VISITATION Patients having surgery or a procedure may have no more than 2 support people in the waiting area - these visitors may rotate.    Children under the age of 74 must have an adult with them who is not the patient.  If the patient needs to stay at the hospital during part of their recovery, the visitor guidelines for inpatient rooms apply. Pre-op nurse will coordinate an appropriate time for 1 support person to accompany patient in pre-op.  This support person may not rotate.    Please refer to the [facility] website for the visitor guidelines for Inpatients (after your surgery is over and you are in a regular room).       Your procedure is scheduled on: 04-08-24   Report to [facility] Main Entrance    Report to admitting at 6:30 AM   Call this number if you have problems the morning of surgery [phone]   Follow a light diet the day before surgery (avoid gas producing foods)   Do not eat food :After Midnight.   After Midnight you may have the following liquids until 5:45 AM DAY OF SURGERY  Water Non-Citrus Juices (without pulp, NO RED-Apple, White grape, White cranberry) Black Coffee (NO MILK/CREAM OR CREAMERS, sugar ok)  Clear Tea (NO MILK/CREAM OR CREAMERS, sugar ok) regular and decaf                             Plain Jell-O (NO RED)                                           Fruit ices (not with fruit pulp, NO RED)                                     Popsicles (NO RED)                                                               Sports drinks like Gatorade (NO RED)                        If you have questions, please contact your surgeon's office.   FOLLOW BOWEL PREP AND ANY ADDITIONAL PRE OP INSTRUCTIONS YOU RECEIVED FROM YOUR SURGEON'S OFFICE!!!     Oral Hygiene is also important to reduce your risk of infection.                                    Remember - BRUSH YOUR TEETH THE MORNING OF SURGERY WITH YOUR REGULAR  TOOTHPASTE   Do NOT smoke after Midnight   Take these medicines the morning of surgery with A SIP OF WATER:    Biktarvy    Stop all vitamins and herbal supplements 7 days before surgery                            You  may not have any metal on your body including hair pins, jewelry, and body piercing             Do not wear make-up, lotions, powders, perfumes, or deodorant  Do not wear nail polish including gel and S&S, artificial/acrylic nails, or any other type of covering on natural nails including finger and toenails. If you have artificial nails, gel coating, etc. that needs to be removed by a nail salon please have this removed prior to surgery or surgery may need to be canceled/ delayed if the surgeon/ anesthesia feels like they are unable to be safely monitored.   Do not shave  48 hours prior to surgery.           Do not bring valuables to the hospital. [facility] IS NOT RESPONSIBLE   FOR VALUABLES.   Contacts, dentures or bridgework may not be worn into surgery.  DO NOT BRING YOUR HOME MEDICATIONS TO THE HOSPITAL. PHARMACY WILL DISPENSE MEDICATIONS LISTED ON YOUR MEDICATION LIST TO YOU DURING YOUR ADMISSION IN THE HOSPITAL!    Patients discharged on the day of surgery will not be allowed to drive home.  Someone NEEDS to stay with you for the first 24 hours after anesthesia.               Please read over the following fact sheets you were given: IF YOU HAVE QUESTIONS ABOUT YOUR PRE-OP INSTRUCTIONS PLEASE CALL [phone] [facility]  If you received a COVID test during your pre-op visit  it is requested that you wear a mask when out in public, stay away from anyone that may not be feeling well and notify your surgeon if you develop symptoms. If you test positive for Covid or have been in contact with anyone that has tested positive in the last 10 days please notify you surgeon.  [facility] - Preparing for Surgery Before surgery, you can play an important role.  Because skin is not  sterile, your skin needs to be as free of germs as possible.  You can reduce the number of germs on your skin by washing with CHG (chlorahexidine gluconate) soap before surgery.  CHG is an antiseptic cleaner which kills germs and bonds with the skin to continue killing germs even after washing. Please DO NOT use if you have an allergy to CHG or antibacterial soaps.  If your skin becomes reddened/irritated stop using the CHG and inform your nurse when you arrive at Short Stay. Do not shave (including legs and underarms) for at least 48 hours prior to the first CHG shower.  You may shave your face/neck.  Please follow these instructions carefully:  1.  Shower with CHG Soap the night before surgery and the  morning of surgery.  2.  If you choose to wash your hair, wash your hair first as usual with your normal  shampoo.  3.  After you shampoo, rinse your hair and body thoroughly to remove the shampoo.                             4.  Use CHG as you would any other liquid soap.  You can apply chg directly to the skin and wash.  Gently with a scrungie or clean washcloth.  5.  Apply the CHG Soap to your body ONLY FROM THE NECK DOWN.   Do   not use on face/ open  Wound or open sores. Avoid contact with eyes, ears mouth and   genitals (private parts).                       Wash face,  Genitals (private parts) with your normal soap.             6.  Wash thoroughly, paying special attention to the area where your    surgery  will be performed.  7.  Thoroughly rinse your body with warm water from the neck down.  8.  DO NOT shower/wash with your normal soap after using and rinsing off the CHG Soap.                9.  Pat yourself dry with a clean towel.            10.  Wear clean pajamas.            11.  Place clean sheets on your bed the night of your first shower and do not  sleep with pets. Day of Surgery : Do not apply any lotions/deodorants the morning of surgery.  Please wear  clean clothes to the hospital/surgery center.  FAILURE TO FOLLOW THESE INSTRUCTIONS MAY RESULT IN THE CANCELLATION OF YOUR SURGERY  PATIENT SIGNATURE_________________________________  NURSE SIGNATURE__________________________________  ________________________________________________________________________   WHAT IS A BLOOD TRANSFUSION? Blood Transfusion Information  A transfusion is the replacement of blood or some of its parts. Blood is made up of multiple cells which provide different functions. Red blood cells carry oxygen and are used for blood loss replacement. White blood cells fight against infection. Platelets control bleeding. Plasma helps clot blood. Other blood products are available for specialized needs, such as hemophilia or other clotting disorders. BEFORE THE TRANSFUSION  Who gives blood for transfusions?  Healthy volunteers who are fully evaluated to make sure their blood is safe. This is blood bank blood. Transfusion therapy is the safest it has ever been in the practice of medicine. Before blood is taken from a donor, a complete history is taken to make sure that person has no history of diseases nor engages in risky social behavior (examples are intravenous drug use or sexual activity with multiple partners). The donor's travel history is screened to minimize risk of transmitting infections, such as malaria. The donated blood is tested for signs of infectious diseases, such as HIV and hepatitis. The blood is then tested to be sure it is compatible with you in order to minimize the chance of a transfusion reaction. If you or a relative donates blood, this is often done in anticipation of surgery and is not appropriate for emergency situations. It takes many days to process the donated blood. RISKS AND COMPLICATIONS Although transfusion therapy is very safe and saves many lives, the main dangers of transfusion include:  Getting an infectious disease. Developing a  transfusion reaction. This is an allergic reaction to something in the blood you were given. Every precaution is taken to prevent this. The decision to have a blood transfusion has been considered carefully by your caregiver before blood is given. Blood is not given unless the benefits outweigh the risks. AFTER THE TRANSFUSION Right after receiving a blood transfusion, you will usually feel much better and more energetic. This is especially true if your red blood cells have gotten low (anemic). The transfusion raises the level of the red blood cells which carry oxygen, and this usually causes an energy increase. The nurse administering the  transfusion will monitor you carefully for complications. HOME CARE INSTRUCTIONS  No special instructions are needed after a transfusion. You may find your energy is better. Speak with your caregiver about any limitations on activity for underlying diseases you may have. SEEK MEDICAL CARE IF:  Your condition is not improving after your transfusion. You develop redness or irritation at the intravenous (IV) site. SEEK IMMEDIATE MEDICAL CARE IF:  Any of the following symptoms occur over the next 12 hours: Shaking chills. You have a temperature by mouth above 102 F (38.9 C), not controlled by medicine. Chest, back, or muscle pain. People around you feel you are not acting correctly or are confused. Shortness of breath or difficulty breathing. Dizziness and fainting. You get a rash or develop hives. You have a decrease in urine output. Your urine turns a dark color or changes to pink, red, or brown. Any of the following symptoms occur over the next 10 days: You have a temperature by mouth above 102 F (38.9 C), not controlled by medicine. Shortness of breath. Weakness after normal activity. The white part of the eye turns yellow (jaundice). You have a decrease in the amount of urine or are urinating less often. Your urine turns a dark color or changes to  pink, red, or brown. Document Released: 09/22/2000 Document Revised: 12/18/2011 Document Reviewed: 05/11/2008 [facility] Patient Information 2014 [place], [place].  _______________________________________________________________________

## 2024-04-03 ENCOUNTER — Encounter (HOSPITAL_COMMUNITY)
Admission: RE | Admit: 2024-04-03 | Discharge: 2024-04-03 | Disposition: A | Discharge status: Home / Self Care | Source: Ambulatory Visit | Attending: Psychiatry | Admitting: Psychiatry

## 2024-04-03 ENCOUNTER — Telehealth: Payer: Self-pay | Admitting: Oncology

## 2024-04-03 ENCOUNTER — Encounter (HOSPITAL_COMMUNITY): Payer: Self-pay

## 2024-04-03 ENCOUNTER — Other Ambulatory Visit: Payer: Self-pay | Admitting: Hematology and Oncology

## 2024-04-03 ENCOUNTER — Other Ambulatory Visit: Payer: Self-pay

## 2024-04-03 DIAGNOSIS — D539 Nutritional anemia, unspecified: Secondary | ICD-10-CM | POA: Insufficient documentation

## 2024-04-03 DIAGNOSIS — Z01812 Encounter for preprocedural laboratory examination: Secondary | ICD-10-CM | POA: Diagnosis present

## 2024-04-03 DIAGNOSIS — C541 Malignant neoplasm of endometrium: Secondary | ICD-10-CM | POA: Diagnosis not present

## 2024-04-03 HISTORY — DX: Anxiety disorder, unspecified: F41.9

## 2024-04-03 HISTORY — DX: Malignant neoplasm of endometrium: C54.1

## 2024-04-03 HISTORY — DX: Anemia, unspecified: D64.9

## 2024-04-03 LAB — COMPREHENSIVE METABOLIC PANEL WITH GFR
ALT: 14 U/L (ref 0–44)
AST: 13 U/L — ABNORMAL LOW (ref 15–41)
Albumin: 3.3 g/dL — ABNORMAL LOW (ref 3.5–5.0)
Alkaline Phosphatase: 65 U/L (ref 38–126)
Anion gap: 9 (ref 5–15)
BUN: 12 mg/dL (ref 6–20)
CO2: 22 mmol/L (ref 22–32)
Calcium: 8.7 mg/dL — ABNORMAL LOW (ref 8.9–10.3)
Chloride: 108 mmol/L (ref 98–111)
Creatinine, Ser: 0.75 mg/dL (ref 0.44–1.00)
GFR, Estimated: >60 mL/min (ref >60)
Glucose, Bld: 109 mg/dL — ABNORMAL HIGH (ref 70–99)
Potassium: 3.5 mmol/L (ref 3.5–5.1)
Sodium: 139 mmol/L (ref 135–145)
Total Bilirubin: 0.4 mg/dL (ref 0.0–1.2)
Total Protein: 8.1 g/dL (ref 6.5–8.1)

## 2024-04-03 LAB — CBC
HCT: 25.7 % — ABNORMAL LOW (ref 36.0–46.0)
Hemoglobin: 7.4 g/dL — ABNORMAL LOW (ref 12.0–15.0)
MCH: 21.9 pg — ABNORMAL LOW (ref 26.0–34.0)
MCHC: 28.8 g/dL — ABNORMAL LOW (ref 30.0–36.0)
MCV: 76 fL — ABNORMAL LOW (ref 80.0–100.0)
Platelets: 511 10*3/uL — ABNORMAL HIGH (ref 150–400)
RBC: 3.38 MIL/uL — ABNORMAL LOW (ref 3.87–5.11)
RDW: 16.2 % — ABNORMAL HIGH (ref 11.5–15.5)
WBC: 9.7 10*3/uL (ref 4.0–10.5)
nRBC: 0 % (ref 0.0–0.2)

## 2024-04-03 LAB — TYPE AND SCREEN: ABO/RH(D): B POS

## 2024-04-03 NOTE — Telephone Encounter (Signed)
 Called Kristy White and let her know that her HGB level was 7.4 today and that she has been referred to hematology for a blood transfusion before surgery.  Advised her of lab appointment at 1:30 and new patient appointment with Dr. Lonn at 2:00 tomorrow.  Also advised her of the infusion appointment on 04/05/2024 at 8:00 for the blood transfusion.  She verbalized understanding and agreement of the appointments.

## 2024-04-04 ENCOUNTER — Inpatient Hospital Stay

## 2024-04-04 ENCOUNTER — Inpatient Hospital Stay (HOSPITAL_BASED_OUTPATIENT_CLINIC_OR_DEPARTMENT_OTHER): Admitting: Hematology and Oncology

## 2024-04-04 ENCOUNTER — Encounter: Payer: Self-pay | Admitting: Hematology and Oncology

## 2024-04-04 VITALS — BP 140/66 | HR 87 | Temp 97.7°F | Resp 18 | Ht 65.5 in | Wt 262.8 lb

## 2024-04-04 DIAGNOSIS — N920 Excessive and frequent menstruation with regular cycle: Secondary | ICD-10-CM | POA: Diagnosis not present

## 2024-04-04 DIAGNOSIS — D539 Nutritional anemia, unspecified: Secondary | ICD-10-CM | POA: Diagnosis not present

## 2024-04-04 DIAGNOSIS — C541 Malignant neoplasm of endometrium: Secondary | ICD-10-CM | POA: Diagnosis not present

## 2024-04-04 DIAGNOSIS — D75838 Other thrombocytosis: Secondary | ICD-10-CM | POA: Insufficient documentation

## 2024-04-04 LAB — CBC WITH DIFFERENTIAL (CANCER CENTER ONLY)
Abs Immature Granulocytes: 0.03 10*3/uL (ref 0.00–0.07)
Basophils Absolute: 0.1 10*3/uL (ref 0.0–0.1)
Basophils Relative: 1 %
Eosinophils Absolute: 0.1 10*3/uL (ref 0.0–0.5)
Eosinophils Relative: 1 %
HCT: 24.3 % — ABNORMAL LOW (ref 36.0–46.0)
Hemoglobin: 7.7 g/dL — ABNORMAL LOW (ref 12.0–15.0)
Immature Granulocytes: 0 %
Lymphocytes Relative: 25 %
Lymphs Abs: 2.2 10*3/uL (ref 0.7–4.0)
MCH: 22.1 pg — ABNORMAL LOW (ref 26.0–34.0)
MCHC: 31.7 g/dL (ref 30.0–36.0)
MCV: 69.8 fL — ABNORMAL LOW (ref 80.0–100.0)
Monocytes Absolute: 0.4 10*3/uL (ref 0.1–1.0)
Monocytes Relative: 5 %
Neutro Abs: 6.2 10*3/uL (ref 1.7–7.7)
Neutrophils Relative %: 68 %
Platelet Count: 507 10*3/uL — ABNORMAL HIGH (ref 150–400)
RBC: 3.48 MIL/uL — ABNORMAL LOW (ref 3.87–5.11)
RDW: 16.1 % — ABNORMAL HIGH (ref 11.5–15.5)
Smear Review: NORMAL
WBC Count: 9 10*3/uL (ref 4.0–10.5)
nRBC: 0 % (ref 0.0–0.2)

## 2024-04-04 LAB — IRON AND IRON BINDING CAPACITY (CC-WL,HP ONLY)
Iron: 18 ug/dL — ABNORMAL LOW (ref 28–170)
Saturation Ratios: 4 % — ABNORMAL LOW (ref 10.4–31.8)
TIBC: 428 ug/dL (ref 250–450)
UIBC: 410 ug/dL (ref 148–442)

## 2024-04-04 LAB — VITAMIN B12: Vitamin B-12: 304 pg/mL (ref 180–914)

## 2024-04-04 LAB — RETICULOCYTES
Immature Retic Fract: 37.4 % — ABNORMAL HIGH (ref 2.3–15.9)
RBC.: 3.52 MIL/uL — ABNORMAL LOW (ref 3.87–5.11)
Retic Count, Absolute: 53.9 10*3/uL (ref 19.0–186.0)
Retic Ct Pct: 1.5 % (ref 0.4–3.1)

## 2024-04-04 LAB — ABO/RH: ABO/RH(D): B POS

## 2024-04-04 LAB — FERRITIN: Ferritin: 12 ng/mL (ref 11–307)

## 2024-04-04 LAB — SAMPLE TO BLOOD BANK

## 2024-04-04 LAB — TSH: TSH: 3.72 u[IU]/mL (ref 0.350–4.500)

## 2024-04-04 LAB — PREPARE RBC (CROSSMATCH)

## 2024-04-04 LAB — SEDIMENTATION RATE: Sed Rate: 56 mm/h — ABNORMAL HIGH (ref 0–22)

## 2024-04-04 NOTE — Assessment & Plan Note (Signed)
 This is likely reactive thrombocytosis from iron deficiency anemia and her cancer

## 2024-04-04 NOTE — Progress Notes (Signed)
 [facility] CONSULT NOTE  Patient Care Team: Patient, [name] as PCP - General (General Practice) Comer, Lamar ORN, MD as PCP - Infectious Diseases (Infectious Diseases)  ASSESSMENT & PLAN:  Deficiency anemia She is being referred to see me due to severe anemia and she needs surgery next week We discussed the role of blood transfusion support Given severity of her anemia, I recommend 2 units of blood and she agreed to proceed We will get her labs rechecked next week before her surgery  We discussed some of the risks, benefits, and alternatives of blood transfusions. The patient is symptomatic from anemia and the hemoglobin level is critically low.  Some of the side-effects to be expected including risks of transfusion reactions, chills, infection, syndrome of volume overload and risk of hospitalization from various reasons and the patient is willing to proceed and went ahead to sign consent today.  I have ordered additional testing for workup of anemia If confirm iron deficiency, she can benefit from intravenous iron infusion as well I recommend she returns on Monday to have her labs repeated prior to surgery to confirm that her hemoglobin has improved to above 8 g before surgery  Endometrial cancer, grade I (HCC) She will proceed with surgery as scheduled with GYN surgeon  Reactive thrombocytosis This is likely reactive thrombocytosis from iron deficiency anemia and her cancer  Orders Placed This Encounter  Procedures   CBC with Differential/Platelet    Standing Status:   Standing    Number of Occurrences:   22    Expiration Date:   04/04/2025   Informed Consent Details: Physician/Practitioner Attestation; Transcribe to consent form and obtain patient signature    Standing Status:   Future    Expiration Date:   04/04/2025    Physician/Practitioner attestation of informed consent for blood and or blood product transfusion:   I, the physician/practitioner, attest that  I have discussed with the patient the benefits, risks, side effects, alternatives, likelihood of achieving goals and potential problems during recovery for the procedure that I have provided informed consent.    Product(s):   All Product(s)   Care order/instruction    Transfuse Parameters    Standing Status:   Future    Expiration Date:   04/04/2025   Type and screen         Standing Status:   Future    Number of Occurrences:   1    Expected Date:   04/04/2024    Expiration Date:   04/04/2025   Prepare RBC (crossmatch)    Standing Status:   Standing    Number of Occurrences:   1    # of Units:   2 units    Transfusion Indications:   Hemoglobin 8 gm/dL or less and orthopedic or cardiac surgery or pre-existing cardiac condition    Number of Units to Keep Ahead:   NO units ahead    Instructions::   Transfuse    If emergent release call blood bank:   Not emergent release    All questions were answered. The patient knows to call the clinic with any problems, questions or concerns.  The total time spent in the appointment was 60 minutes encounter with patients including review of chart and various tests results, discussions about plan of care and coordination of care plan  Almarie Bedford, MD 6/27/20252:54 PM   CHIEF COMPLAINTS/PURPOSE OF CONSULTATION:  Anemia  HISTORY OF PRESENTING ILLNESS:  Kristy White 48 y.o. female  is here because of anemia She was found to have abnormal CBC from recent blood work  I have the opportunity to review her CBC dated back to 2012 Between 2012-20 25, her hemoglobin ranges between 10.1-11.5 This year, she has noted to have progressive anemia On February 04, 2024, her hemoglobin was 8.7 with elevated platelet count of 473 She was evaluated by gynecologist and was found to have endometrial cancer She had imaging studies and plan for surgery Her preoperative blood work from April 03, 2024 showed white count of 9.7, hemoglobin 7.4, MCV of 76.0 and platelet  count of 511  She denies recent chest pain on exertion, pre-syncopal episodes, or palpitations.  She has not shortness of breath on exertion and complaint of fatigue She has been complaining of chronic heavy menstruation She had not noticed any recent bleeding such as epistaxis, hematuria or hematochezia The patient denies over the counter NSAID ingestion. She is not on antiplatelets agents. She has never had colonoscopy done She denies any pica and eats a variety of diet, but majority of her diet is high in carbohydrates and low in protein She never donated blood or received blood transfusion The patient was prescribed oral iron supplements and she takes it for several months but none recently  MEDICAL HISTORY:  Past Medical History:  Diagnosis Date   Abnormal Pap smear of cervix 03/06/2012   Anemia    Anxiety    Endometrial cancer (HCC)    HIV infection (HCC)     SURGICAL HISTORY: Past Surgical History:  Procedure Laterality Date   CESAREAN SECTION      SOCIAL HISTORY: Social History   Socioeconomic History   Marital status: Single    Spouse name: Not on file   Number of children: 1   Years of education: Not on file   Highest education level: Associate degree: academic program  Occupational History   Occupation: Architect at [employer]  Tobacco Use   Smoking status: Never   Smokeless tobacco: Never  Vaping Use   Vaping status: Never Used  Substance and Sexual Activity   Alcohol use: No    Alcohol/week: 0.0 standard drinks of alcohol   Drug use: No   Sexual activity: Not Currently    Comment: declined condoms  Other Topics Concern   Not on file  Social History Narrative   Lives with daughter   Social Drivers of Health   Financial Resource Strain: Not on file  Food Insecurity: No Food Insecurity (04/04/2024)   Hunger Vital Sign    Worried About Running Out of Food in the Last Year: Never true    Ran Out of Food in the Last Year: Never true   Transportation Needs: No Transportation Needs (04/04/2024)   PRAPARE - Administrator, Civil Service (Medical): No    Lack of Transportation (Non-Medical): No  Physical Activity: Not on file  Stress: Not on file  Social Connections: Not on file  Intimate Partner Violence: Not At Risk (04/04/2024)   Humiliation, Afraid, Rape, and Kick questionnaire    Fear of Current or Ex-Partner: No    Emotionally Abused: No    Physically Abused: No    Sexually Abused: No    FAMILY HISTORY: Family History  Problem Relation Age of Onset   Mental illness Mother    Breast cancer Neg Hx    Ovarian cancer Neg Hx    Colon cancer Neg Hx    Endometrial cancer Neg Hx  ALLERGIES:  has no known allergies.  MEDICATIONS:  Current Outpatient Medications  Medication Sig Dispense Refill   BIKTARVY  50-200-25 MG TABS tablet TAKE 1 TABLET BY MOUTH DAILY 30 tablet 5   senna-docusate (SENOKOT-S) 8.6-50 MG tablet Take 2 tablets by mouth at bedtime. For AFTER surgery, do not take if having diarrhea (Patient not taking: Reported on 04/03/2024) 30 tablet 0   traMADol  (ULTRAM ) 50 MG tablet Take 1 tablet (50 mg total) by mouth every 6 (six) hours as needed for severe pain (pain score 7-10). For AFTER surgery only, do not take and drive (Patient not taking: Reported on 04/03/2024) 15 tablet 0   No current facility-administered medications for this visit.    REVIEW OF SYSTEMS:   Constitutional: Denies fevers, chills or abnormal night sweats Eyes: Denies blurriness of vision, double vision or watery eyes Ears, nose, mouth, throat, and face: Denies mucositis or sore throat Cardiovascular: Denies palpitation, chest discomfort or lower extremity swelling Gastrointestinal:  Denies nausea, heartburn or change in bowel habits Skin: Denies abnormal skin rashes Lymphatics: Denies new lymphadenopathy or easy bruising Neurological:Denies numbness, tingling or new weaknesses Behavioral/Psych: Mood is stable, no new  changes  All other systems were reviewed with the patient and are negative.  PHYSICAL EXAMINATION: ECOG PERFORMANCE STATUS: 1 - Symptomatic but completely ambulatory  Vitals:   04/04/24 1421  BP: (!) 140/66  Pulse: 87  Resp: 18  Temp: 97.7 F (36.5 C)  SpO2: 100%   Filed Weights   04/04/24 1421  Weight: 262 lb 12.8 oz (119.2 kg)    GENERAL:alert, no distress and comfortable SKIN: skin color, texture, turgor are normal, no rashes or significant lesions EYES: normal, conjunctiva are pale and non-injected, sclera clear OROPHARYNX:no exudate, no erythema and lips, buccal mucosa, and tongue normal  NECK: supple, thyroid normal size, non-tender, without nodularity LYMPH:  no palpable lymphadenopathy in the cervical, axillary or inguinal LUNGS: clear to auscultation and percussion with normal breathing effort HEART: regular rate & rhythm and no murmurs and no lower extremity edema ABDOMEN:abdomen soft, non-tender and normal bowel sounds Musculoskeletal:no cyanosis of digits and no clubbing  PSYCH: alert & oriented x 3 with fluent speech NEURO: no focal motor/sensory deficits  Lab Results  Component Value Date   WBC 9.7 04/03/2024   HGB 7.4 (L) 04/03/2024   HCT 25.7 (L) 04/03/2024   MCV 76.0 (L) 04/03/2024   PLT 511 (H) 04/03/2024    RADIOGRAPHIC STUDIES: I have personally reviewed the radiological images as listed and agreed with the findings in the report. CT ABDOMEN PELVIS W CONTRAST Result Date: 03/30/2024 CLINICAL DATA:  General cancer, staging. Endometrial cancer grade 1. * Tracking Code: BO * EXAM: CT ABDOMEN AND PELVIS WITH CONTRAST TECHNIQUE: Multidetector CT imaging of the abdomen and pelvis was performed using the standard protocol following bolus administration of intravenous contrast. RADIATION DOSE REDUCTION: This exam was performed according to the departmental dose-optimization program which includes automated exposure control, adjustment of the mA and/or kV  according to patient size and/or use of iterative reconstruction technique. CONTRAST:  OMNIPAQUE  IOHEXOL  300 MG/ML  SOLN COMPARISON:  Pelvic ultrasound Feb 12, 2024 FINDINGS: Lower chest: Motion degraded examination the lung bases. Hepatobiliary: No suspicious hepatic lesion. Gallbladder is unremarkable. No biliary ductal dilation. Pancreas: No pancreatic ductal dilation or evidence of acute inflammation. Spleen: No splenomegaly. Adrenals/Urinary Tract: Bilateral adrenal glands appear normal. Mild left hydronephrosis with perinephric/peripelvic stranding. No hydroureter. No right-sided hydronephrosis. Stomach/Bowel: Stomach is unremarkable for degree of distension. No pathologic  dilation of small or large bowel. No evidence of acute bowel inflammation. Vascular/Lymphatic: Normal caliber abdominal aorta. Circumaortic left renal vein. Borderline enlarged left external iliac lymph node measures 11 mm in short axis on image 83/2. Additional prominent bilateral pelvic lymph nodes for instance a right pelvic sidewall lymph node measuring 6 mm in short axis on image 81/2. Reproductive: Heterogeneous thickening of the endometrium compatible with patient's known endometrial neoplasm. Other: Trace pelvic free fluid. Musculoskeletal: No aggressive lytic or blastic lesion of bone. IMPRESSION: 1. Heterogeneous thickening of the endometrium compatible with patient's known endometrial neoplasm. 2. Borderline enlarged left external iliac lymph node measures 11 mm in short axis, nonspecific but at least somewhat suspicious for nodal disease involvement. Additional prominent bilateral pelvic lymph nodes are nonspecific. 3. Mild left hydronephrosis with perinephric/peripelvic stranding. No hydroureter. Findings may reflect a recently passed stone or ascending urinary tract infection. Suggest correlation with urinalysis 4. Trace pelvic free fluid. Electronically Signed   By: Reyes Holder M.D.   On: 03/30/2024 10:56

## 2024-04-04 NOTE — Assessment & Plan Note (Signed)
 She will proceed with surgery as scheduled with GYN surgeon

## 2024-04-04 NOTE — Assessment & Plan Note (Addendum)
 She is being referred to see me due to severe anemia and she needs surgery next week We discussed the role of blood transfusion support Given severity of her anemia, I recommend 2 units of blood and she agreed to proceed We will get her labs rechecked next week before her surgery  We discussed some of the risks, benefits, and alternatives of blood transfusions. The patient is symptomatic from anemia and the hemoglobin level is critically low.  Some of the side-effects to be expected including risks of transfusion reactions, chills, infection, syndrome of volume overload and risk of hospitalization from various reasons and the patient is willing to proceed and went ahead to sign consent today.  I have ordered additional testing for workup of anemia If confirm iron deficiency, she can benefit from intravenous iron infusion as well I recommend she returns on Monday to have her labs repeated prior to surgery to confirm that her hemoglobin has improved to above 8 g before surgery

## 2024-04-05 ENCOUNTER — Inpatient Hospital Stay

## 2024-04-05 DIAGNOSIS — C541 Malignant neoplasm of endometrium: Secondary | ICD-10-CM | POA: Diagnosis not present

## 2024-04-05 DIAGNOSIS — D539 Nutritional anemia, unspecified: Secondary | ICD-10-CM

## 2024-04-05 MED ORDER — DIPHENHYDRAMINE HCL 25 MG PO CAPS
25.0000 mg | ORAL_CAPSULE | Freq: Once | ORAL | Status: AC
  Administered 2024-04-05: 25 mg via ORAL
  Filled 2024-04-05: qty 1

## 2024-04-05 MED ORDER — SODIUM CHLORIDE 0.9% IV SOLUTION: 250.0000 mL | INTRAVENOUS | Status: DC

## 2024-04-05 MED ORDER — ACETAMINOPHEN 325 MG PO TABS
650.0000 mg | ORAL_TABLET | Freq: Once | ORAL | Status: AC
  Administered 2024-04-05: 650 mg via ORAL
  Filled 2024-04-05: qty 2

## 2024-04-05 NOTE — Patient Instructions (Signed)

## 2024-04-07 ENCOUNTER — Telehealth: Payer: Self-pay | Admitting: *Deleted

## 2024-04-07 ENCOUNTER — Ambulatory Visit: Payer: Self-pay | Admitting: Hematology and Oncology

## 2024-04-07 ENCOUNTER — Other Ambulatory Visit: Payer: Self-pay | Admitting: Hematology and Oncology

## 2024-04-07 ENCOUNTER — Inpatient Hospital Stay

## 2024-04-07 DIAGNOSIS — C541 Malignant neoplasm of endometrium: Secondary | ICD-10-CM | POA: Diagnosis not present

## 2024-04-07 DIAGNOSIS — D539 Nutritional anemia, unspecified: Secondary | ICD-10-CM

## 2024-04-07 LAB — CBC WITH DIFFERENTIAL/PLATELET
Abs Immature Granulocytes: 0.03 10*3/uL (ref 0.00–0.07)
Basophils Absolute: 0.1 10*3/uL (ref 0.0–0.1)
Basophils Relative: 1 %
Eosinophils Absolute: 0.2 10*3/uL (ref 0.0–0.5)
Eosinophils Relative: 2 %
HCT: 28.8 % — ABNORMAL LOW (ref 36.0–46.0)
Hemoglobin: 9.2 g/dL — ABNORMAL LOW (ref 12.0–15.0)
Immature Granulocytes: 0 %
Lymphocytes Relative: 23 %
Lymphs Abs: 2.4 10*3/uL (ref 0.7–4.0)
MCH: 23.3 pg — ABNORMAL LOW (ref 26.0–34.0)
MCHC: 31.9 g/dL (ref 30.0–36.0)
MCV: 72.9 fL — ABNORMAL LOW (ref 80.0–100.0)
Monocytes Absolute: 0.7 10*3/uL (ref 0.1–1.0)
Monocytes Relative: 7 %
Neutro Abs: 6.8 10*3/uL (ref 1.7–7.7)
Neutrophils Relative %: 67 %
Platelets: 493 10*3/uL — ABNORMAL HIGH (ref 150–400)
RBC: 3.95 MIL/uL (ref 3.87–5.11)
RDW: 17.8 % — ABNORMAL HIGH (ref 11.5–15.5)
WBC: 10.2 10*3/uL (ref 4.0–10.5)
nRBC: 0.2 % (ref 0.0–0.2)

## 2024-04-07 LAB — BPAM RBC
Blood Product Expiration Date: 202507302359
Blood Product Unit Number: 202507302359
ISSUE DATE / TIME: 202506280952
PRODUCT CODE: 202506280952
PRODUCT CODE: 202507302359
Unit Type and Rh: 7300
Unit Type and Rh: 202507302359
Unit Type and Rh: 7300
Unit Type and Rh: 7300

## 2024-04-07 LAB — TYPE AND SCREEN
ABO/RH(D): B POS
Antibody Screen: NEGATIVE
Unit division: 0
Unit division: 0

## 2024-04-07 NOTE — Progress Notes (Signed)
 Anesthesia Chart Review   Case: [id number] Date/Time: 04/08/24 0830   Procedures:      HYSTERECTOMY, TOTAL, ROBOT-ASSISTED, LAPAROSCOPIC, WITH BILATERAL SALPINGO-OOPHORECTOMY (Bilateral)     INJECTION, FOR SENTINEL LYMPH NODE IDENTIFICATION     LYMPH NODE BIOPSY   Anesthesia type: General   Diagnosis: Endometrial cancer (HCC) [C54.1]   Pre-op diagnosis: ENDOMETRIAL CANCER   Location: [facility] ROOM 05 / [facility]   Surgeons: Eldonna Mays, MD       DISCUSSION:48 y.o. never smoker with h/o HIV, anemia, endometrial cancer scheduled for above procedure 04/08/2024 with Dr. Mays Eldonna.   Hemoglobin 7.4 at PAT visit. Referred to Dr. Lonn. She received 2 units of blood. Per notes labs to be checked before surgery.   Labs 04/07/2024 with Hemoglobin 9.2.  VS: BP 132/81   Pulse 83   Temp 36.8 C (Oral)   Resp 16   Ht 5' 5.5 (1.664 m)   Wt 116.6 kg   SpO2 100%   BMI 42.12 kg/m   PROVIDERS: Patient, [name]   LABS: Labs reviewed: Acceptable for surgery. and anemia treated (all labs ordered are listed, but only abnormal results are displayed)  Labs Reviewed  CBC - Abnormal; Notable for the following components:      Result Value   RBC 3.38 (*)    Hemoglobin 7.4 (*)    HCT 25.7 (*)    MCV 76.0 (*)    MCH 21.9 (*)    MCHC 28.8 (*)    RDW 16.2 (*)    Platelets 511 (*)    All other components within normal limits  COMPREHENSIVE METABOLIC PANEL WITH GFR - Abnormal; Notable for the following components:   Glucose, Bld 109 (*)    Calcium 8.7 (*)    Albumin 3.3 (*)    AST 13 (*)    All other components within normal limits  TYPE AND SCREEN     IMAGES:   EKG:   CV:  Past Medical History:  Diagnosis Date   Abnormal Pap smear of cervix 03/06/2012   Anemia    Anxiety    Endometrial cancer (HCC)    HIV infection (HCC)     Past Surgical History:  Procedure Laterality Date   CESAREAN SECTION      MEDICATIONS:  BIKTARVY  50-200-25 MG TABS tablet   senna-docusate  (SENOKOT-S) 8.6-50 MG tablet   traMADol  (ULTRAM ) 50 MG tablet   No current facility-administered medications for this encounter.      Harlene Hoots [place], PA-C [facility] [phone]

## 2024-04-07 NOTE — Telephone Encounter (Signed)
 Attempted to reach patient for pre-op call. Left voicemail requesting call back.

## 2024-04-07 NOTE — Telephone Encounter (Signed)
Telephone call to check on pre-operative status.  Patient compliant with pre-operative instructions.  Reinforced nothing to eat after midnight. Clear liquids until 0530. Patient to arrive at 0630.  No questions or concerns voiced.  Instructed to call for any needs. 

## 2024-04-08 ENCOUNTER — Other Ambulatory Visit: Payer: Self-pay

## 2024-04-08 ENCOUNTER — Ambulatory Visit (HOSPITAL_COMMUNITY): Payer: Self-pay | Admitting: Physician Assistant

## 2024-04-08 ENCOUNTER — Encounter (HOSPITAL_COMMUNITY)
Admission: RE | Disposition: A | Discharge status: Home / Self Care | Payer: Self-pay | Source: Home / Self Care | Attending: Psychiatry

## 2024-04-08 ENCOUNTER — Encounter (HOSPITAL_COMMUNITY): Payer: Self-pay | Admitting: Psychiatry

## 2024-04-08 ENCOUNTER — Ambulatory Visit (HOSPITAL_COMMUNITY)
Admission: RE | Admit: 2024-04-08 | Discharge: 2024-04-08 | Disposition: A | Discharge status: Home / Self Care | Attending: Psychiatry | Admitting: Psychiatry

## 2024-04-08 ENCOUNTER — Ambulatory Visit (HOSPITAL_COMMUNITY): Payer: Self-pay | Admitting: Registered Nurse

## 2024-04-08 DIAGNOSIS — D649 Anemia, unspecified: Secondary | ICD-10-CM | POA: Insufficient documentation

## 2024-04-08 DIAGNOSIS — R519 Headache, unspecified: Secondary | ICD-10-CM | POA: Insufficient documentation

## 2024-04-08 DIAGNOSIS — R9389 Abnormal findings on diagnostic imaging of other specified body structures: Secondary | ICD-10-CM | POA: Diagnosis not present

## 2024-04-08 DIAGNOSIS — F419 Anxiety disorder, unspecified: Secondary | ICD-10-CM | POA: Insufficient documentation

## 2024-04-08 DIAGNOSIS — N841 Polyp of cervix uteri: Secondary | ICD-10-CM | POA: Diagnosis not present

## 2024-04-08 DIAGNOSIS — F32A Depression, unspecified: Secondary | ICD-10-CM | POA: Diagnosis not present

## 2024-04-08 DIAGNOSIS — D251 Intramural leiomyoma of uterus: Secondary | ICD-10-CM | POA: Insufficient documentation

## 2024-04-08 DIAGNOSIS — S3141XA Laceration without foreign body of vagina and vulva, initial encounter: Secondary | ICD-10-CM | POA: Insufficient documentation

## 2024-04-08 DIAGNOSIS — E66813 Obesity, class 3: Secondary | ICD-10-CM | POA: Insufficient documentation

## 2024-04-08 DIAGNOSIS — N838 Other noninflammatory disorders of ovary, fallopian tube and broad ligament: Secondary | ICD-10-CM | POA: Diagnosis not present

## 2024-04-08 DIAGNOSIS — Z6841 Body Mass Index (BMI) 40.0 and over, adult: Secondary | ICD-10-CM

## 2024-04-08 DIAGNOSIS — Z79899 Other long term (current) drug therapy: Secondary | ICD-10-CM | POA: Diagnosis not present

## 2024-04-08 DIAGNOSIS — Y658 Other specified misadventures during surgical and medical care: Secondary | ICD-10-CM | POA: Insufficient documentation

## 2024-04-08 DIAGNOSIS — C541 Malignant neoplasm of endometrium: Secondary | ICD-10-CM | POA: Diagnosis present

## 2024-04-08 DIAGNOSIS — N9971 Accidental puncture and laceration of a genitourinary system organ or structure during a genitourinary system procedure: Secondary | ICD-10-CM | POA: Insufficient documentation

## 2024-04-08 HISTORY — PX: INJECTION, FOR SENTINEL LYMPH NODE IDENTIFICATION: SHX7598

## 2024-04-08 HISTORY — PX: ROBOTIC ASSISTED TOTAL HYSTERECTOMY WITH BILATERAL SALPINGO OOPHERECTOMY: SHX6086

## 2024-04-08 HISTORY — PX: LYMPH NODE BIOPSY: SHX201

## 2024-04-08 LAB — TYPE AND SCREEN
ABO/RH(D): B POS
Antibody Screen: NEGATIVE
Antibody Screen: NEGATIVE

## 2024-04-08 LAB — POCT PREGNANCY, URINE: Preg Test, Ur: NEGATIVE

## 2024-04-08 SURGERY — HYSTERECTOMY, TOTAL, ROBOT-ASSISTED, LAPAROSCOPIC, WITH BILATERAL SALPINGO-OOPHORECTOMY
Anesthesia: General | Site: Pelvis | Laterality: Bilateral

## 2024-04-08 MED ORDER — PHENYLEPHRINE 80 MCG/ML (10ML) SYRINGE FOR IV PUSH (FOR BLOOD PRESSURE SUPPORT)
PREFILLED_SYRINGE | INTRAVENOUS | Status: AC
  Filled 2024-04-08: qty 10

## 2024-04-08 MED ORDER — BUPIVACAINE HCL 0.25 % IJ SOLN
INTRAMUSCULAR | Status: DC | PRN
  Administered 2024-04-08: 10 mL

## 2024-04-08 MED ORDER — LACTATED RINGERS IR SOLN
Status: DC | PRN
  Administered 2024-04-08: 1000 mL

## 2024-04-08 MED ORDER — ONDANSETRON HCL 4 MG/2ML IJ SOLN
INTRAMUSCULAR | Status: DC | PRN
  Administered 2024-04-08: 4 mg via INTRAVENOUS

## 2024-04-08 MED ORDER — LACTATED RINGERS IV SOLN: INTRAVENOUS | Status: DC | PRN

## 2024-04-08 MED ORDER — STERILE WATER FOR INJECTION IJ SOLN
INTRAMUSCULAR | Status: DC | PRN
  Administered 2024-04-08: 5 mL

## 2024-04-08 MED ORDER — OXYCODONE HCL 5 MG/5ML PO SOLN: 5.0000 mg | Freq: Once | ORAL | Status: DC | PRN

## 2024-04-08 MED ORDER — METRONIDAZOLE 500 MG/100ML IV SOLN
500.0000 mg | INTRAVENOUS | Status: AC
  Administered 2024-04-08: 500 mg via INTRAVENOUS
  Filled 2024-04-08: qty 100

## 2024-04-08 MED ORDER — BUPIVACAINE HCL (PF) 0.25 % IJ SOLN
INTRAMUSCULAR | Status: AC
  Filled 2024-04-08: qty 30

## 2024-04-08 MED ORDER — PROPOFOL 10 MG/ML IV BOLUS
INTRAVENOUS | Status: DC | PRN
  Administered 2024-04-08: 150 mg via INTRAVENOUS

## 2024-04-08 MED ORDER — STERILE WATER FOR IRRIGATION IR SOLN
Status: DC | PRN
  Administered 2024-04-08: 1000 mL

## 2024-04-08 MED ORDER — HYDROMORPHONE HCL 1 MG/ML IJ SOLN
0.2500 mg | INTRAMUSCULAR | Status: DC | PRN
  Administered 2024-04-08 (×2): 0.5 mg via INTRAVENOUS

## 2024-04-08 MED ORDER — MIDAZOLAM HCL 2 MG/2ML IJ SOLN
INTRAMUSCULAR | Status: AC
  Filled 2024-04-08: qty 2

## 2024-04-08 MED ORDER — KETAMINE HCL 50 MG/5ML IJ SOSY
PREFILLED_SYRINGE | INTRAMUSCULAR | Status: AC
  Filled 2024-04-08: qty 5

## 2024-04-08 MED ORDER — AMISULPRIDE (ANTIEMETIC) 5 MG/2ML IV SOLN: 10.0000 mg | Freq: Once | INTRAVENOUS | Status: DC | PRN

## 2024-04-08 MED ORDER — CEFAZOLIN SODIUM-DEXTROSE 2-4 GM/100ML-% IV SOLN
2.0000 g | INTRAVENOUS | Status: AC
  Administered 2024-04-08: 2 g via INTRAVENOUS
  Filled 2024-04-08: qty 100

## 2024-04-08 MED ORDER — ACETAMINOPHEN 500 MG PO TABS
1000.0000 mg | ORAL_TABLET | ORAL | Status: AC
  Administered 2024-04-08: 1000 mg via ORAL
  Filled 2024-04-08: qty 2

## 2024-04-08 MED ORDER — DEXAMETHASONE SODIUM PHOSPHATE 10 MG/ML IJ SOLN: 4.0000 mg | INTRAMUSCULAR | Status: DC

## 2024-04-08 MED ORDER — FENTANYL CITRATE (PF) 100 MCG/2ML IJ SOLN
INTRAMUSCULAR | Status: DC | PRN
  Administered 2024-04-08: 100 ug via INTRAVENOUS
  Administered 2024-04-08: 50 ug via INTRAVENOUS
  Administered 2024-04-08: 100 ug via INTRAVENOUS

## 2024-04-08 MED ORDER — STERILE WATER FOR INJECTION IJ SOLN
INTRAMUSCULAR | Status: DC | PRN
  Administered 2024-04-08: 4 mL via INTRAMUSCULAR

## 2024-04-08 MED ORDER — CHLORHEXIDINE GLUCONATE 0.12 % MT SOLN
15.0000 mL | Freq: Once | OROMUCOSAL | Status: AC
Start: 2024-04-08 — End: 2024-04-08
  Administered 2024-04-08: 15 mL via OROMUCOSAL

## 2024-04-08 MED ORDER — HEPARIN SODIUM (PORCINE) 5000 UNIT/ML IJ SOLN
5000.0000 [IU] | INTRAMUSCULAR | Status: AC
  Administered 2024-04-08: 5000 [IU] via SUBCUTANEOUS
  Filled 2024-04-08: qty 1

## 2024-04-08 MED ORDER — HYDROMORPHONE HCL 1 MG/ML IJ SOLN
INTRAMUSCULAR | Status: AC
Start: 2024-04-08 — End: 2024-04-08
  Filled 2024-04-08: qty 1

## 2024-04-08 MED ORDER — KETAMINE HCL 10 MG/ML IJ SOLN: INTRAMUSCULAR | Status: DC | PRN

## 2024-04-08 MED ORDER — SCOPOLAMINE 1 MG/3DAYS TD PT72
1.0000 | MEDICATED_PATCH | TRANSDERMAL | Status: DC
  Administered 2024-04-08: 1.5 mg via TRANSDERMAL
  Filled 2024-04-08: qty 1

## 2024-04-08 MED ORDER — STERILE WATER FOR INJECTION IJ SOLN
INTRAMUSCULAR | Status: AC
Start: 2024-04-08 — End: 2024-04-08
  Filled 2024-04-08: qty 10

## 2024-04-08 MED ORDER — SUGAMMADEX SODIUM 200 MG/2ML IV SOLN
INTRAVENOUS | Status: DC | PRN
  Administered 2024-04-08: 400 mg via INTRAVENOUS

## 2024-04-08 MED ORDER — ONDANSETRON HCL 4 MG/2ML IJ SOLN
INTRAMUSCULAR | Status: AC
  Filled 2024-04-08: qty 2

## 2024-04-08 MED ORDER — PHENYLEPHRINE 80 MCG/ML (10ML) SYRINGE FOR IV PUSH (FOR BLOOD PRESSURE SUPPORT)
PREFILLED_SYRINGE | INTRAVENOUS | Status: AC
Start: 2024-04-08 — End: 2024-04-08
  Filled 2024-04-08: qty 10

## 2024-04-08 MED ORDER — LIDOCAINE HCL (PF) 2 % IJ SOLN
INTRAMUSCULAR | Status: AC
Start: 2024-04-08 — End: 2024-04-08
  Filled 2024-04-08: qty 5

## 2024-04-08 MED ORDER — HYDROMORPHONE HCL 1 MG/ML IJ SOLN
INTRAMUSCULAR | Status: DC | PRN
  Administered 2024-04-08: .6 mg via INTRAVENOUS

## 2024-04-08 MED ORDER — LACTATED RINGERS IV SOLN: INTRAVENOUS | Status: DC

## 2024-04-08 MED ORDER — FENTANYL CITRATE (PF) 250 MCG/5ML IJ SOLN
INTRAMUSCULAR | Status: AC
  Filled 2024-04-08: qty 5

## 2024-04-08 MED ORDER — PHENYLEPHRINE HCL-NACL 20-0.9 MG/250ML-% IV SOLN
INTRAVENOUS | Status: AC
  Filled 2024-04-08: qty 500

## 2024-04-08 MED ORDER — PHENYLEPHRINE 80 MCG/ML (10ML) SYRINGE FOR IV PUSH (FOR BLOOD PRESSURE SUPPORT)
PREFILLED_SYRINGE | INTRAVENOUS | Status: DC | PRN
Start: 2024-04-08 — End: 2024-04-08
  Administered 2024-04-08 (×2): 80 ug via INTRAVENOUS
  Administered 2024-04-08 (×2): 160 ug via INTRAVENOUS

## 2024-04-08 MED ORDER — KETAMINE HCL 50 MG/5ML IJ SOSY
PREFILLED_SYRINGE | INTRAMUSCULAR | Status: DC | PRN
Start: 2024-04-08 — End: 2024-04-08
  Administered 2024-04-08: 30 mg via INTRAVENOUS
  Administered 2024-04-08 (×2): 10 mg via INTRAVENOUS

## 2024-04-08 MED ORDER — ROCURONIUM BROMIDE 10 MG/ML (PF) SYRINGE
PREFILLED_SYRINGE | INTRAVENOUS | Status: AC
Start: 2024-04-08 — End: 2024-04-08
  Filled 2024-04-08: qty 10

## 2024-04-08 MED ORDER — MIDAZOLAM HCL 5 MG/5ML IJ SOLN
INTRAMUSCULAR | Status: DC | PRN
Start: 2024-04-08 — End: 2024-04-08
  Administered 2024-04-08: 2 mg via INTRAVENOUS

## 2024-04-08 MED ORDER — ROCURONIUM BROMIDE 100 MG/10ML IV SOLN
INTRAVENOUS | Status: DC | PRN
  Administered 2024-04-08: 50 mg via INTRAVENOUS
  Administered 2024-04-08: 30 mg via INTRAVENOUS
  Administered 2024-04-08 (×2): 20 mg via INTRAVENOUS

## 2024-04-08 MED ORDER — DEXAMETHASONE SODIUM PHOSPHATE 10 MG/ML IJ SOLN
INTRAMUSCULAR | Status: AC
Start: 2024-04-08 — End: 2024-04-08
  Filled 2024-04-08: qty 1

## 2024-04-08 MED ORDER — LIDOCAINE HCL (CARDIAC) PF 100 MG/5ML IV SOSY
PREFILLED_SYRINGE | INTRAVENOUS | Status: DC | PRN
  Administered 2024-04-08: 100 mg via INTRAVENOUS

## 2024-04-08 MED ORDER — HYDROMORPHONE HCL 2 MG/ML IJ SOLN
INTRAMUSCULAR | Status: AC
  Filled 2024-04-08: qty 1

## 2024-04-08 MED ORDER — ORAL CARE MOUTH RINSE: 15.0000 mL | Freq: Once | OROMUCOSAL | Status: AC

## 2024-04-08 MED ORDER — GABAPENTIN 300 MG PO CAPS
300.0000 mg | ORAL_CAPSULE | ORAL | Status: AC
  Administered 2024-04-08: 300 mg via ORAL
  Filled 2024-04-08: qty 1

## 2024-04-08 MED ORDER — OXYCODONE HCL 5 MG PO TABS: 5.0000 mg | ORAL_TABLET | Freq: Once | ORAL | Status: DC | PRN

## 2024-04-08 MED ORDER — PHENYLEPHRINE HCL-NACL 20-0.9 MG/250ML-% IV SOLN
INTRAVENOUS | Status: DC | PRN
  Administered 2024-04-08: 40 ug/min via INTRAVENOUS

## 2024-04-08 MED ORDER — ROCURONIUM BROMIDE 10 MG/ML (PF) SYRINGE
PREFILLED_SYRINGE | INTRAVENOUS | Status: AC
  Filled 2024-04-08: qty 10

## 2024-04-08 MED ORDER — DEXAMETHASONE SODIUM PHOSPHATE 10 MG/ML IJ SOLN
INTRAMUSCULAR | Status: DC | PRN
Start: 2024-04-08 — End: 2024-04-08
  Administered 2024-04-08: 10 mg via INTRAVENOUS

## 2024-04-08 MED ORDER — ALBUMIN HUMAN 5 % IV SOLN
INTRAVENOUS | Status: AC
  Filled 2024-04-08: qty 250

## 2024-04-08 SURGICAL SUPPLY — 65 items
APPLICATOR SURGIFLO ENDO (HEMOSTASIS) IMPLANT
BAG LAPAROSCOPIC 12 15 PORT 16 (BASKET) IMPLANT
BLADE SURG SZ10 CARB STEEL (BLADE) IMPLANT
COVER BACK TABLE 60X90IN (DRAPES) ×1 IMPLANT
COVER TIP SHEARS 8 DVNC (MISCELLANEOUS) ×1 IMPLANT
DERMABOND ADVANCED .7 DNX12 (GAUZE/BANDAGES/DRESSINGS) ×1 IMPLANT
DRAPE ARM DVNC X/XI (DISPOSABLE) ×4 IMPLANT
DRAPE COLUMN DVNC XI (DISPOSABLE) ×1 IMPLANT
DRAPE SHEET LG 3/4 BI-LAMINATE (DRAPES) ×1 IMPLANT
DRAPE SURG IRRIG POUCH 19X23 (DRAPES) ×1 IMPLANT
DRIVER NDL MEGA SUTCUT DVNCXI (INSTRUMENTS) ×1 IMPLANT
DRIVER NDLE MEGA SUTCUT DVNCXI (INSTRUMENTS) ×1 IMPLANT
DRSG OPSITE POSTOP 4X6 (GAUZE/BANDAGES/DRESSINGS) IMPLANT
DRSG OPSITE POSTOP 4X8 (GAUZE/BANDAGES/DRESSINGS) IMPLANT
ELECT PENCIL ROCKER SW 15FT (MISCELLANEOUS) IMPLANT
ELECT REM PT RETURN 15FT ADLT (MISCELLANEOUS) ×1 IMPLANT
FORCEPS BPLR FENES DVNC XI (FORCEP) ×1 IMPLANT
FORCEPS PROGRASP DVNC XI (FORCEP) ×1 IMPLANT
GAUZE 4X4 16PLY ~~LOC~~+RFID DBL (SPONGE) ×1 IMPLANT
GLOVE BIO SURGEON STRL SZ 6.5 (GLOVE) ×1 IMPLANT
GLOVE BIOGEL PI IND STRL 6.5 (GLOVE) ×2 IMPLANT
GLOVE BIOGEL PI MICRO STRL 6 (GLOVE) ×4 IMPLANT
GOWN STRL REUS W/ TWL LRG LVL3 (GOWN DISPOSABLE) ×4 IMPLANT
GRASPER SUT TROCAR 14GX15 (MISCELLANEOUS) IMPLANT
HOLDER FOLEY CATH W/STRAP (MISCELLANEOUS) IMPLANT
IRRIGATION SUCT STRKRFLW 2 WTP (MISCELLANEOUS) ×1 IMPLANT
KIT PROCEDURE DVNC SI (MISCELLANEOUS) IMPLANT
KIT TURNOVER KIT A (KITS) ×1 IMPLANT
LIGASURE IMPACT 36 18CM CVD LR (INSTRUMENTS) IMPLANT
MANIPULATOR ADVINCU DEL 3.0 PL (MISCELLANEOUS) IMPLANT
MANIPULATOR ADVINCU DEL 3.5 PL (MISCELLANEOUS) IMPLANT
MANIPULATOR UTERINE 4.5 ZUMI (MISCELLANEOUS) IMPLANT
NDL HYPO 21X1.5 SAFETY (NEEDLE) ×1 IMPLANT
NDL INSUFFLATION 14GA 120MM (NEEDLE) IMPLANT
NDL SPNL 20GX3.5 QUINCKE YW (NEEDLE) IMPLANT
NEEDLE HYPO 21X1.5 SAFETY (NEEDLE) ×1 IMPLANT
NEEDLE INSUFFLATION 14GA 120MM (NEEDLE) IMPLANT
NEEDLE SPNL 20GX3.5 QUINCKE YW (NEEDLE) IMPLANT
OBTURATOR OPTICALSTD 8 DVNC (TROCAR) ×1 IMPLANT
PACK ROBOT GYN CUSTOM WL (TRAY / TRAY PROCEDURE) ×1 IMPLANT
PAD ARMBOARD POSITIONER FOAM (MISCELLANEOUS) ×1 IMPLANT
PAD POSITIONING PINK XL (MISCELLANEOUS) ×1 IMPLANT
PORT ACCESS TROCAR AIRSEAL 12 (TROCAR) IMPLANT
SCISSORS MNPLR CVD DVNC XI (INSTRUMENTS) ×1 IMPLANT
SCRUB CHG 4% DYNA-HEX 4OZ (MISCELLANEOUS) ×2 IMPLANT
SEAL UNIV 5-12 XI (MISCELLANEOUS) ×4 IMPLANT
SET TRI-LUMEN FLTR TB AIRSEAL (TUBING) ×1 IMPLANT
SPIKE FLUID TRANSFER (MISCELLANEOUS) ×1 IMPLANT
SPONGE T-LAP 18X18 ~~LOC~~+RFID (SPONGE) IMPLANT
SURGIFLO W/THROMBIN 8M KIT (HEMOSTASIS) IMPLANT
SUT MNCRL AB 4-0 PS2 18 (SUTURE) IMPLANT
SUT PDS AB 1 TP1 54 (SUTURE) IMPLANT
SUT VIC AB 0 CT1 27XBRD ANTBC (SUTURE) IMPLANT
SUT VIC AB 2-0 CT1 TAPERPNT 27 (SUTURE) IMPLANT
SUT VIC AB 4-0 PS2 18 (SUTURE) ×2 IMPLANT
SUT VICRYL 0 27 CT2 27 ABS (SUTURE) ×1 IMPLANT
SYR 10ML LL (SYRINGE) IMPLANT
SYSTEM BAG RETRIEVAL 10MM (BASKET) IMPLANT
SYSTEM WOUND ALEXIS 18CM MED (MISCELLANEOUS) IMPLANT
TRAP SPECIMEN MUCUS 40CC (MISCELLANEOUS) IMPLANT
TRAY FOLEY MTR SLVR 16FR STAT (SET/KITS/TRAYS/PACK) ×1 IMPLANT
TROCAR PORT AIRSEAL 5X120 (TROCAR) IMPLANT
UNDERPAD 30X36 HEAVY ABSORB (UNDERPADS AND DIAPERS) ×2 IMPLANT
WATER STERILE IRR 1000ML POUR (IV SOLUTION) ×1 IMPLANT
YANKAUER SUCT BULB TIP 10FT TU (MISCELLANEOUS) IMPLANT

## 2024-04-08 NOTE — Transfer of Care (Signed)
 Immediate Anesthesia Transfer of Care Note  Patient: Kristy White  Procedure(s) Performed: ROBOTIC ASSISTED TOTAL LAPAROSCOPIC HYSTERECTOMY WITH BILATERAL SALPINGO-OOPHORECTOMY (Bilateral: Pelvis) SENTINEL LYMPH NODE INJECTION (Bilateral: Pelvis) SENTINEL LYMPH NODE BIOPSY (Bilateral: Pelvis)  Patient Location: PACU  Anesthesia Type:General  Level of Consciousness: awake, alert , oriented, and patient cooperative  Airway & Oxygen Therapy: Patient Spontanous Breathing and Patient connected to face mask oxygen  Post-op Assessment: Report given to RN and Post -op Vital signs reviewed and stable  Post vital signs: Reviewed and stable  Last Vitals:  Vitals Value Taken Time  BP 139/84 04/08/24 14:56  Temp 36.6 C 04/08/24 14:56  Pulse 90 04/08/24 14:58  Resp 16 04/08/24 14:58  SpO2 97 % 04/08/24 14:58  Vitals shown include unfiled device data.  Last Pain:  Vitals:   04/08/24 0659  TempSrc: Oral         Complications: There were no known notable events for this encounter.

## 2024-04-08 NOTE — Anesthesia Procedure Notes (Signed)
 Procedure Name: Intubation Date/Time: 04/08/2024 11:34 AM  Performed by: Christopher Comings, CRNAPre-anesthesia Checklist: Patient identified, Emergency Drugs available, Suction available and Patient being monitored Patient Re-evaluated:Patient Re-evaluated prior to induction Oxygen Delivery Method: Circle system utilized Preoxygenation: Pre-oxygenation with 100% oxygen Induction Type: IV induction Ventilation: Mask ventilation without difficulty Laryngoscope Size: Mac and 4 Grade View: Grade I Tube type: Oral Tube size: 7.0 mm Number of attempts: 1 Airway Equipment and Method: Stylet and Oral airway Placement Confirmation: ETT inserted through vocal cords under direct vision, positive ETCO2 and breath sounds checked- equal and bilateral Secured at: 22 cm Tube secured with: Tape Dental Injury: Teeth and Oropharynx as per pre-operative assessment

## 2024-04-08 NOTE — Discharge Instructions (Addendum)
 AFTER SURGERY INSTRUCTIONS   Return to work: 4-6 weeks if applicable   Activity: 1. Be up and out of the bed during the day.  Take a nap if needed.  You may walk up steps but be careful and use the hand rail.  Stair climbing will tire you more than you think, you may need to stop part way and rest.    2. No lifting or straining for 6 weeks over 10 pounds. No pushing, pulling, straining for 6 weeks.   3. No driving for 4-89 days when the following criteria have been met: Do not drive if you are taking narcotic pain medicine and make sure that your reaction time has returned.    4. You can shower as soon as the next day after surgery. Shower daily.  Use your regular soap and water (not directly on the incision) and pat your incision(s) dry afterwards; don't rub.  No tub baths or submerging your body in water until cleared by your surgeon. If you have the soap that was given to you by pre-surgical testing that was used before surgery, you do not need to use it afterwards because this can irritate your incisions.    5. No sexual activity and nothing in the vagina for 12 weeks.   6. You may experience a small amount of clear drainage from your incisions, which is normal.  If the drainage persists, increases, or changes color please call the office.   7. Do not use creams, lotions, or ointments such as neosporin on your incisions after surgery until advised by your surgeon because they can cause removal of the dermabond glue on your incisions.     8. You may experience vaginal spotting after surgery or when the stitches at the top of the vagina begin to dissolve.  The spotting is normal but if you experience heavy bleeding, call our office.   9. Take Tylenol  or ibuprofen first for pain if you are able to take these medications and only use narcotic pain medication for severe pain not relieved by the Tylenol  or Ibuprofen.  Monitor your Tylenol  intake to a max of 4,000 mg in a 24 hour period. You can  alternate these medications after surgery.   Diet: 1. Low sodium Heart Healthy Diet is recommended but you are cleared to resume your normal (before surgery) diet after your procedure.   2. It is safe to use a laxative, such as Miralax or Colace, if you have difficulty moving your bowels before surgery. You have been prescribed Sennakot-S to take at bedtime every evening after surgery to keep bowel movements regular and to prevent constipation.     Wound Care: 1. Keep clean and dry.  Shower daily.   Reasons to call the Doctor: Fever - Oral temperature greater than 100.4 degrees Fahrenheit Foul-smelling vaginal discharge Difficulty urinating Nausea and vomiting Increased pain at the site of the incision that is unrelieved with pain medicine. Difficulty breathing with or without chest pain New calf pain especially if only on one side Sudden, continuing increased vaginal bleeding with or without clots.   Contacts: For questions or concerns you should contact:   Dr. Eldonna at [phone]   Eleanor Epps, NP at [phone]   After Hours: call [phone] and have the GYN Oncologist paged/contacted (after 5 pm or on the weekends). You will speak with an after hours RN and let he or she know you have had surgery.   Messages sent via mychart are for non-urgent matters  and are not responded to after hours so for urgent needs, please call the after hours number.

## 2024-04-08 NOTE — Interval H&P Note (Signed)
 History and Physical Interval Note:  04/08/2024 7:31 AM  Kristy White  has presented today for surgery, with the diagnosis of ENDOMETRIAL CANCER.  The various methods of treatment have been discussed with the patient and family. After consideration of risks, benefits and other options for treatment, the patient has consented to  Procedure(s): HYSTERECTOMY, TOTAL, ROBOT-ASSISTED, LAPAROSCOPIC, WITH BILATERAL SALPINGO-OOPHORECTOMY (Bilateral) INJECTION, FOR SENTINEL LYMPH NODE IDENTIFICATION (N/A) LYMPH NODE BIOPSY (N/A) as a surgical intervention.  The patient's history has been reviewed, patient examined, no change in status, stable for surgery.  I have reviewed the patient's chart and labs.  Questions were answered to the patient's satisfaction.     [name]

## 2024-04-08 NOTE — Anesthesia Preprocedure Evaluation (Addendum)
 Anesthesia Evaluation  Patient identified by MRN, date of birth, ID band Patient awake    Reviewed: Allergy & Precautions, NPO status , Patient's Chart, lab work & pertinent test results  Airway Mallampati: I  TM Distance: >3 FB Neck ROM: Full    Dental  (+) Dental Advisory Given, Missing, Chipped,    Pulmonary neg pulmonary ROS   Pulmonary exam normal breath sounds clear to auscultation       Cardiovascular negative cardio ROS Normal cardiovascular exam Rhythm:Regular Rate:Normal     Neuro/Psych  Headaches PSYCHIATRIC DISORDERS Anxiety Depression       GI/Hepatic negative GI ROS, Neg liver ROS,,,  Endo/Other    Class 3 obesity (BMI 43)  Renal/GU negative Renal ROS  negative genitourinary   Musculoskeletal negative musculoskeletal ROS (+)    Abdominal   Peds  Hematology  (+) Blood dyscrasia, anemia , HIVLab Results      Component                Value               Date                      WBC                      10.2                04/07/2024                HGB                      9.2 (L)             04/07/2024                HCT                      28.8 (L)            04/07/2024                MCV                      72.9 (L)            04/07/2024                PLT                      493 (H)             04/07/2024              Anesthesia Other Findings   Reproductive/Obstetrics                             Anesthesia Physical Anesthesia Plan  ASA: 3  Anesthesia Plan: General   Post-op Pain Management: Tylenol  PO (pre-op)* and Ketamine IV*   Induction: Intravenous  PONV Risk Score and Plan: 3 and Midazolam, Dexamethasone and Ondansetron  Airway Management Planned: Oral ETT  Additional Equipment:   Intra-op Plan:   Post-operative Plan: Extubation in OR  Informed Consent: I have reviewed the patients History and Physical, chart, labs and discussed the procedure  including the risks, benefits and alternatives for the proposed anesthesia with the patient or authorized representative who has indicated his/her understanding  and acceptance.     Dental advisory given  Plan Discussed with: CRNA  Anesthesia Plan Comments: (2 IVs)       Anesthesia Quick Evaluation

## 2024-04-08 NOTE — Op Note (Signed)
 GYNECOLOGIC ONCOLOGY OPERATIVE NOTE  Date of Service: 04/08/2024  Preoperative Diagnosis: FIGO grade 1 endometrioid endometrial cancer, morbid obesity (BMI 43)  Postoperative Diagnosis: Same  Procedures: Robotic-assisted total laparoscopic hysterectomy, bilateral salpingo-oophorectomy, bilateral sentinel lymph node evaluation and biopsy, vaginal laceration repair (Modifer 22: extreme morbid obesity, BMI 43, with significant retroperitoneal and intraperitoneal adiposity requiring additional OR personnel for positioning and retraction. Obesity made retroperitoneal visualization limited, increasing the complexity of the case and necessitating additional instrumentation for retraction and to create safe exposure. 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  Otherwise normal upper abdominal survey with normal liver surface and diaphragm. Normal appearing small and large bowel.  Distal omentum adherent to the pelvis on the left and to the left lower uterine segment of the uterus.  Globally enlarged uterus.  Bilateral fallopian tubes dilated and with clumped fimbria.  Normal-appearing ovaries.  Filmy adhesions in the posterior cul-de-sac.   No evidence of peritoneal disease, ascites, or carcinomatosis. Sentinel mapping on right to the obturator space; sentinel mapping on left to the obturator space.   Specimens:  ID Type Source Tests  Collected by Time Destination  1 : Right obturator sentinel lymph node Tissue PATH Lymph node excision SURGICAL PATHOLOGY Masters Hoy, MD 04/08/2024 1249   2 : Left obturator sentinel lymph node Tissue PATH Lymph node excision SURGICAL PATHOLOGY Masters Hoy, MD 04/08/2024 1317   3 : Uterus, cervix, bilateral tubes and ovaries Tissue PATH Gyn benign resection SURGICAL PATHOLOGY Masters Hoy, MD 04/08/2024 1356     Complications:  None  Indications for Procedure: Kristy White is a 48 y.o. woman with FIGO grade 1 endometrioid endometrial cancer.  Prior to the procedure, all risks, benefits, and alternatives were discussed and informed surgical consent was signed.  Procedure: Patient was taken to the operating room where general anesthesia was achieved.  She was positioned in dorsal lithotomy and prepped and draped.  A foley catheter was inserted into the bladder. 1 ml of dilute Indo-Cyanine dye was was injected at 1cm and 1mm deep at 3 and 9 o'clock in the cervical stroma.  The cervix was dilated and an Advincula uterine manipulator with a colpotomy ring was inserted into the uterus.  A 12 mm incision was made in the left upper quadrant near Palmer's point.  The abdomen was entered with a 5 mm OptiView trocar under direct visualization.  The abdomen was insufflated, the patient placed in steep Trendelenburg, and additional trocars were placed as follows: an 8mm trocar superior to the umbilicus, two 8 mm robotic trocars in the right abdomen, and one 8 mm robotic trocar in the left abdomen.  The left upper quadrant trocar was removed and replaced with a 12 mm airseal trocar.  All trocars were placed under direct visualization.  The bowels were moved into the upper abdomen.  The DaVinci robotic surgical system was brought to the patient's bedside and docked.  The omentum was adherent to the left pelvic sidewall and the left  anterior lower uterine segment of the uterus.  The omentum was dissected  off of the surfaces with combination of blunt and sharp dissection.  The omentum was then mobilized to the upper abdomen.  Adhesions of the small bowel to the right lower quadrant were lysed sharply with scissors.  The right round ligament was transected and the retroperitoneum entered.The right ureter was identified. The paravesical and pararectal spaces were opened, and the node was found to be located in the obturator space. A sentinel lymph node dissection was performed taking care to avoid injury to the ureter, superior vesicle artery or obturator nerve. A similar procedure was performed on left with a sentinel lymph node also identified in the obturator space. The sentinel lymph nodes mentioned above were identified and removed through the assistant trocar.  The right ureter was again identified, and the right infundibulopelvic ligament was isolated, cauterized, and transected. The posterior peritoneum was opened to the colpotomy ring. The anterior peritoneum was opened and the bladder flap was created. The right uterine artery was skeletonized, cauterized, and transected at the level of the colpotomy ring. Additional cautery was used in a C-shaped fashion to allow the remainder of the broad, cardinal, and uterosacral ligaments with the uterine vessels to be transected and fall away from the colpotomy ring.  A similar procedure was performed on the contralateral side. A colpotomy was made circumferentially following the contours of the colpotomy ring.  The uterine specimen was then placed in an Endo Catch bag and removed through the vagina.  The vaginal cuff was closed with a running stitch of 0 Vicryl suture.  The pelvis was irrigated and all operative sites were found to be hemostatic.  All instruments were removed and the robot was taken from the patient's bedside. The fascia at the 12 mm incision was closed with 0 Vicryl using a PMI device. The abdomen was desufflated and all ports were removed. The  skin at all incisions was closed with 4-0 Vicryl to reapproximate the subcutaneous tissue and 4-0 monocryl in a subcuticular fashion followed by surgical glue.  The vagina was then inspected and noted to have bleeding a right labia minora laceration extending to the right periurethral area.  Using 3-0 Vicryl, 3 figure-of-eight stitches were placed to reapproximate this laceration and obtain hemostasis.  Patient tolerated the procedure well. Sponge, lap, and instrument counts were correct.  Patient received 2 gm of Ancef and 500 mg of metronidazole  prior to skin incision for routine perioperative antibiotic prophylaxis.  She was extubated and taken to the PACU in stable condition.  Hoy Masters, MD Gynecologic Oncology

## 2024-04-09 ENCOUNTER — Encounter (HOSPITAL_COMMUNITY): Payer: Self-pay | Admitting: Psychiatry

## 2024-04-09 ENCOUNTER — Telehealth: Payer: Self-pay | Admitting: *Deleted

## 2024-04-09 NOTE — Anesthesia Postprocedure Evaluation (Signed)
 Anesthesia Post Note  Patient: Kristy White  Procedure(s) Performed: ROBOTIC ASSISTED TOTAL LAPAROSCOPIC HYSTERECTOMY WITH BILATERAL SALPINGO-OOPHORECTOMY (Bilateral: Pelvis) SENTINEL LYMPH NODE INJECTION (Bilateral: Pelvis) SENTINEL LYMPH NODE BIOPSY (Bilateral: Pelvis)     Patient location during evaluation: PACU Anesthesia Type: General Level of consciousness: awake and alert Pain management: pain level controlled Vital Signs Assessment: post-procedure vital signs reviewed and stable Respiratory status: spontaneous breathing, nonlabored ventilation, respiratory function stable and patient connected to nasal cannula oxygen Cardiovascular status: blood pressure returned to baseline and stable Postop Assessment: no apparent nausea or vomiting Anesthetic complications: no   There were no known notable events for this encounter.  Last Vitals:  Vitals:   04/08/24 1545 04/08/24 1600  BP: (!) 143/87 118/79  Pulse: 79 67  Resp: 17 13  Temp:    SpO2: 92% 93%    Last Pain:  Vitals:   04/08/24 1605  TempSrc:   PainSc: 2                  Neida Ellegood L Milinda Sweeney

## 2024-04-09 NOTE — Telephone Encounter (Signed)
 Spoke with Ms. Kempen this morning. She states she is eating, drinking and urinating well. She has not had a BM yet and is not passing gas. Patient hasn't started taking her senokot yet, she states the pharmacy didn't have her medications. Office called [facility] and they had put the Rx back b/c Rx sent in on 6/9. They will have Rx ready today within an hour.  Encouraged her to drink plenty of water, ambulate, hot tea and take over the counter gas-x . She denies fever or chills. Incisions are dry and intact. She rates her pain 4/10. Her pain is controlled with tylenol .     Ms. Hutt will have someone go pick her Rx up for her.  Instructed to call office with any fever, chills, purulent drainage, uncontrolled pain or any other questions or concerns. Patient verbalizes understanding.   Pt aware of post op appointments as well as the office number [phone] and after hours number [phone] to call if she has any questions or concerns

## 2024-04-14 ENCOUNTER — Ambulatory Visit: Payer: Self-pay | Admitting: Psychiatry

## 2024-04-14 ENCOUNTER — Telehealth: Payer: Self-pay

## 2024-04-14 NOTE — Telephone Encounter (Addendum)
 I called the patients [employer] for coverage information.  This patients plan is out of network with [facility] as a whole except for ER and urgent care visits. She has no out of network benefits.  I called the patient to give her this information and see if she knows where she can get her infusion in network. I had to leave a voicemail.  Ph # [phone] Ref #[id number]  Will update here as needed.

## 2024-04-14 NOTE — Telephone Encounter (Signed)
 Hi Val,  Please call her and make sure she has the message If unable to call her, let me know

## 2024-04-17 LAB — SURGICAL PATHOLOGY

## 2024-04-28 ENCOUNTER — Encounter: Payer: Self-pay | Admitting: Psychiatry

## 2024-04-28 ENCOUNTER — Inpatient Hospital Stay: Attending: Psychiatry | Admitting: Psychiatry

## 2024-04-28 VITALS — BP 136/69 | HR 72 | Temp 98.5°F | Resp 20 | Wt 262.4 lb

## 2024-04-28 DIAGNOSIS — Z7189 Other specified counseling: Secondary | ICD-10-CM

## 2024-04-28 DIAGNOSIS — Z90722 Acquired absence of ovaries, bilateral: Secondary | ICD-10-CM

## 2024-04-28 DIAGNOSIS — C541 Malignant neoplasm of endometrium: Secondary | ICD-10-CM | POA: Insufficient documentation

## 2024-04-28 DIAGNOSIS — Z90710 Acquired absence of both cervix and uterus: Secondary | ICD-10-CM | POA: Diagnosis not present

## 2024-04-28 NOTE — Patient Instructions (Signed)
 It was a pleasure to see you in clinic today. - Try miralax once or twice a day instead of the sennakot - No lifting until 6 weeks after surgery. Okay to return to work at 6 weeks postop - Return visit planned for 6 months  Thank you very much for allowing me to provide care for you today.  I appreciate your confidence in choosing our Gynecologic Oncology team at [facility].  If you have any questions about your visit today please call our office or send us  a MyChart message and we will get back to you as soon as possible.

## 2024-04-28 NOTE — Progress Notes (Signed)
 Gynecologic Oncology Return Clinic Visit  Date of Service: 04/28/2024 Referring Provider: Herchel Gloris LABOR, MD [address] [place],  [place] [address]  Assessment & Plan: Kristy White is a 48 y.o. 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  Postop: - Pt recovering well from surgery and healing appropriately postoperatively - Ongoing postoperative expectations and precautions reviewed. Continue with no lifting >10lbs through 6 weeks postoperatively - Pt works a Scientist, research (medical). Okay to return to work at 6 weeks postop.  Endometrial cancer: - Pathology results reviewed in detail - No high-intermediate risk factors. - Recommend observation. - Signs/symptoms of recurrence reviewed. - Surveillance reviewed. Follow-up q6 months x2 years then yearly.   RTC 28mo.  Hoy Masters, MD Gynecologic Oncology   Medical Decision Making I personally spent  TOTAL 20 minutes face-to-face and non-face-to-face in the care of this patient, which includes all pre, intra, and post visit time on the date of service. The discussion of endometrial cancer treatment is beyond the scope of routine postoperative care.   ----------------------- Reason for Visit: Postop/Treatment counseling  Treatment History: Oncology History  Endometrial cancer, grade I (HCC)  02/12/2024 Imaging   1. 3 cm fundal right uterine fibroid. 2. Endometrium is thickened measuring 26.5 mm. If bleeding remains unresponsive to hormonal or medical therapy, focal lesion work-up with sonohysterogram should be considered. Endometrial biopsy should also be considered in pre-menopausal patients at high risk for endometrial carcinoma. (Ref: Radiological Reasoning: Algorithmic Workup of Abnormal Vaginal Bleeding with Endovaginal Sonography and Sonohysterography. AJR 2008; 808:D31-26) 3. Tubular fluid-filled structure  in the right adnexa suggesting hydrosalpinx.   02/20/2024 Pathology Results   A. ENDOMETRIUM, BIOPSY:  - Endometrioid carcinoma, FIGO grade 1.  See comment.   COMMENT:  Immunohistochemical stain p53 is pending and will be reported in an addendum.  This case was reviewed with Dr. Legolvan who agrees with the above diagnosis.  ADDENDUM:  - Immunohistochemical stain for p53 shows wild-type staining.  Control  is adequate.     02/25/2024 Initial Diagnosis   Endometrial cancer, grade I (HCC)   03/30/2024 Imaging   CT ABDOMEN PELVIS W CONTRAST Result Date: 03/30/2024 CLINICAL DATA:  General cancer, staging. Endometrial cancer grade 1. * Tracking Code: BO * EXAM: CT ABDOMEN AND PELVIS WITH CONTRAST TECHNIQUE: Multidetector CT imaging of the abdomen and pelvis was performed using the standard protocol following bolus administration of intravenous contrast. RADIATION DOSE REDUCTION: This exam was performed according to the departmental dose-optimization program which includes automated exposure control, adjustment of the mA and/or kV according to patient size and/or use of iterative reconstruction technique. CONTRAST:  OMNIPAQUE  IOHEXOL  300 MG/ML  SOLN COMPARISON:  Pelvic ultrasound Feb 12, 2024 FINDINGS: Lower chest: Motion degraded examination the lung bases. Hepatobiliary: No suspicious hepatic lesion. Gallbladder is unremarkable. No biliary ductal dilation. Pancreas: No pancreatic ductal dilation or evidence of acute inflammation. Spleen: No splenomegaly. Adrenals/Urinary Tract: Bilateral adrenal glands appear normal. Mild left hydronephrosis with perinephric/peripelvic stranding. No hydroureter. No right-sided hydronephrosis. Stomach/Bowel: Stomach is unremarkable for degree of distension. No pathologic dilation of small or large bowel. No evidence of acute bowel inflammation. Vascular/Lymphatic: Normal caliber abdominal aorta. Circumaortic left renal vein. Borderline enlarged left external iliac  lymph node measures 11 mm in short axis on image 83/2. Additional prominent bilateral pelvic lymph nodes for instance a right pelvic sidewall lymph node measuring 6 mm in short  axis on image 81/2. Reproductive: Heterogeneous thickening of the endometrium compatible with patient's known endometrial neoplasm. Other: Trace pelvic free fluid. Musculoskeletal: No aggressive lytic or blastic lesion of bone. IMPRESSION: 1. Heterogeneous thickening of the endometrium compatible with patient's known endometrial neoplasm. 2. Borderline enlarged left external iliac lymph node measures 11 mm in short axis, nonspecific but at least somewhat suspicious for nodal disease involvement. Additional prominent bilateral pelvic lymph nodes are nonspecific. 3. Mild left hydronephrosis with perinephric/peripelvic stranding. No hydroureter. Findings may reflect a recently passed stone or ascending urinary tract infection. Suggest correlation with urinalysis 4. Trace pelvic free fluid. Electronically Signed   By: Reyes Holder M.D.   On: 03/30/2024 10:56      04/08/2024 Cancer Staging   Staging form: Corpus Uteri - Carcinoma and Carcinosarcoma, AJCC 8th Edition and FIGO 2023 - Clinical stage from 04/08/2024: FIGO Stage IA1, calculated as Stage IA (cT1a, cN0, cM0, POLE: Unknown, MMRd-, p53-) - Signed by Eldonna Mays, MD on 04/28/2024 Stage prefix: Initial diagnosis Histologic grade (G): G1 Histologic grading system: 3 grade system   04/08/2024 Surgery   Procedures: Robotic-assisted total laparoscopic hysterectomy, bilateral salpingo-oophorectomy, bilateral sentinel lymph node evaluation and biopsy, vaginal laceration repair  Findings: Upper abdominal survey with thin adhesions of the liver to the anterior abdominal wall particularly on the right, 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  Otherwise normal upper abdominal survey with normal liver surface and diaphragm. Normal appearing small and large bowel.  Distal omentum adherent  to the pelvis on the left and to the left lower uterine segment of the uterus.  Globally enlarged uterus.  Bilateral fallopian tubes dilated and with clumped fimbria.  Normal-appearing ovaries.  Filmy adhesions in the posterior cul-de-sac.   No evidence of peritoneal disease, ascites, or carcinomatosis. Sentinel mapping on right to the obturator space; sentinel mapping on left to the obturator space.    04/08/2024 Pathology Results   A. SENTINEL LYMPH NODE, RIGHT OBTURATOR, EXCISION: - Negative for carcinoma (0/1)  B. SENTINEL LYMPH NODE, LEFT OBTURATOR, EXCISION: - Negative for carcinoma (0/1)  C. UTERUS, CERVIX, FALLOPIAN TUBE OVARY, BILATERAL, HYSTERECTOMY: - Cervix: Benign endocervical polyp - Endometrium: Endometrial intraepithelial neoplasia and focus of endometrioid carcinoma, FIGO grade 1, noninvasive - Myometrium: Leiomyomata - Bilateral fallopian tubes or ovaries: Benign, paratubal cyst - See oncology table    ONCOLOGY TABLE:  UTERUS, CARCINOMA OR CARCINOSARCOMA: Resection  Procedure: Total hysterectomy and bilateral salpingo-oophorectomy Histologic Type: Endometrioid carcinoma Histologic Grade: FIGO grade 1 Myometrial Invasion:      Depth of Myometrial Invasion (mm): 0      Myometrial Thickness (mm): 30 mm      Percentage of Myometrial Invasion: 0 Uterine Serosa Involvement: Not identified Cervical stromal Involvement: Not identified Extent of involvement of other tissue/organs: Not identified Peritoneal/Ascitic Fluid: Not applicable Lymphovascular Invasion: Not identified Regional Lymph Nodes:      Pelvic Lymph Nodes Examined:          2 Sentinel          0 non-sentinel          2 total      Pelvic Lymph Nodes with Metastasis: 0          Distant Metastasis:      Distant Site(s) Involved: Not applicable Pathologic Stage Classification (pTNM, AJCC 8th Edition): pT 1 a, pN 0 Ancillary Studies: MMR / MSI testing will be ordered Representative Tumor Block: C5,  C6 Comment(s): Pancytokeratin will be performed and reported in an addendum   IHC  EXPRESSION RESULTS  TEST           RESULT  MLH1:          Preserved nuclear expression  MSH2:          Preserved nuclear expression  MSH6:          Preserved nuclear expression  PMS2:          Preserved nuclear expression      Interval History: Pt reports that she is recovering well from surgery. She is not needing anything anymore for pain. She is eating and drinking well. She is voiding without issue. Having bowel movements but only when taking the senokot. No bleeding.   Past Medical/Surgical History: Past Medical History:  Diagnosis Date   Abnormal Pap smear of cervix 03/06/2012   Anemia    Anxiety    Endometrial cancer (HCC)    HIV infection (HCC)     Past Surgical History:  Procedure Laterality Date   CESAREAN SECTION     INJECTION, FOR SENTINEL LYMPH NODE IDENTIFICATION Bilateral 04/08/2024   Procedure: SENTINEL LYMPH NODE INJECTION;  Surgeon: Eldonna Mays, MD;  Location: [facility];  Service: Gynecology;  Laterality: Bilateral;   LYMPH NODE BIOPSY Bilateral 04/08/2024   Procedure: SENTINEL LYMPH NODE BIOPSY;  Surgeon: Eldonna Mays, MD;  Location: [facility];  Service: Gynecology;  Laterality: Bilateral;   ROBOTIC ASSISTED TOTAL HYSTERECTOMY WITH BILATERAL SALPINGO OOPHERECTOMY Bilateral 04/08/2024   Procedure: ROBOTIC ASSISTED TOTAL LAPAROSCOPIC HYSTERECTOMY WITH BILATERAL SALPINGO-OOPHORECTOMY;  Surgeon: Eldonna Mays, MD;  Location: [facility];  Service: Gynecology;  Laterality: Bilateral;    Family History  Problem Relation Age of Onset   Mental illness Mother    Breast cancer Neg Hx    Ovarian cancer Neg Hx    Colon cancer Neg Hx    Endometrial cancer Neg Hx     Social History   Socioeconomic History   Marital status: Single    Spouse name: Not on file   Number of children: 1   Years of education: Not on file   Highest education level: Associate degree: academic program   Occupational History   Occupation: Architect at [employer]  Tobacco Use   Smoking status: Never   Smokeless tobacco: Never  Vaping Use   Vaping status: Never Used  Substance and Sexual Activity   Alcohol use: No    Alcohol/week: 0.0 standard drinks of alcohol   Drug use: No   Sexual activity: Not Currently    Comment: declined condoms  Other Topics Concern   Not on file  Social History Narrative   Lives with daughter   Social Drivers of Health   Financial Resource Strain: Not on file  Food Insecurity: No Food Insecurity (04/04/2024)   Hunger Vital Sign    Worried About Running Out of Food in the Last Year: Never true    Ran Out of Food in the Last Year: Never true  Transportation Needs: No Transportation Needs (04/04/2024)   PRAPARE - Administrator, Civil Service (Medical): No    Lack of Transportation (Non-Medical): No  Physical Activity: Not on file  Stress: Not on file  Social Connections: Not on file    Current Medications:  Current Outpatient Medications:    BIKTARVY  50-200-25 MG TABS tablet, TAKE 1 TABLET BY MOUTH DAILY, Disp: 30 tablet, Rfl: 5   senna-docusate (SENOKOT-S) 8.6-50 MG tablet, Take 2 tablets by mouth at bedtime. For AFTER surgery, do not take if having diarrhea (Patient not  taking: Reported on 04/03/2024), Disp: 30 tablet, Rfl: 0   traMADol  (ULTRAM ) 50 MG tablet, Take 1 tablet (50 mg total) by mouth every 6 (six) hours as needed for severe pain (pain score 7-10). For AFTER surgery only, do not take and drive (Patient not taking: Reported on 04/03/2024), Disp: 15 tablet, Rfl: 0  Review of Symptoms: Complete 10-system review is negative except as above in Interval History.  Physical Exam: BP 136/69 (BP Location: Left Arm, Patient Position: Sitting)   Pulse 72   Temp 98.5 F (36.9 C) (Oral)   Resp 20   Wt 262 lb 6.4 oz (119 kg)   SpO2 99%   BMI 43.00 kg/m  General: Alert, oriented, no acute distress. HEENT: Normocephalic,  atraumatic. Neck symmetric without masses. Sclera anicteric.  Chest: Normal work of breathing. Clear to auscultation bilaterally.   Cardiovascular: Regular rate and rhythm, no murmurs. Abdomen: Soft, nontender.  Normoactive bowel sounds.  No masses appreciated.  Well-healing incisions with glue. Extremities: Grossly normal range of motion.  Warm, well perfused.  No edema bilaterally. Skin: No rashes or lesions noted. GU: Normal appearing external genitalia without erythema, excoriation, or lesions.  Speculum exam reveals stitches right hymen, healing well. Cuff healing well intact.  Bimanual exam reveals intact vaginal cuff. Exam chaperoned by Eleanor Epps, NP   Laboratory & Radiologic Studies: Surgical pathology (04/08/24): A. SENTINEL LYMPH NODE, RIGHT OBTURATOR, EXCISION: - Negative for carcinoma (0/1)  B. SENTINEL LYMPH NODE, LEFT OBTURATOR, EXCISION: - Negative for carcinoma (0/1)  C. UTERUS, CERVIX, FALLOPIAN TUBE OVARY, BILATERAL, HYSTERECTOMY: - Cervix: Benign endocervical polyp - Endometrium: Endometrial intraepithelial neoplasia and focus of endometrioid carcinoma, FIGO grade 1, noninvasive - Myometrium: Leiomyomata - Bilateral fallopian tubes or ovaries: Benign, paratubal cyst - See oncology table  ONCOLOGY TABLE:  UTERUS, CARCINOMA OR CARCINOSARCOMA: Resection  Procedure: Total hysterectomy and bilateral salpingo-oophorectomy Histologic Type: Endometrioid carcinoma Histologic Grade: FIGO grade 1 Myometrial Invasion:      Depth of Myometrial Invasion (mm): 0      Myometrial Thickness (mm): 30 mm      Percentage of Myometrial Invasion: 0 Uterine Serosa Involvement: Not identified Cervical stromal Involvement: Not identified Extent of involvement of other tissue/organs: Not identified Peritoneal/Ascitic Fluid: Not applicable Lymphovascular Invasion: Not identified Regional Lymph Nodes:      Pelvic Lymph Nodes Examined:          2 Sentinel          0 non-sentinel           2 total      Pelvic Lymph Nodes with Metastasis: 0          Distant Metastasis:      Distant Site(s) Involved: Not applicable Pathologic Stage Classification (pTNM, AJCC 8th Edition): pT 1 a, pN 0 Ancillary Studies: MMR / MSI testing will be ordered Representative Tumor Block: C5, C6 Comment(s): Pancytokeratin will be performed and reported in an addendum   IHC EXPRESSION RESULTS  TEST           RESULT  MLH1:          Preserved nuclear expression  MSH2:          Preserved nuclear expression  MSH6:          Preserved nuclear expression  PMS2:          Preserved nuclear expression

## 2024-05-10 ENCOUNTER — Other Ambulatory Visit: Payer: Self-pay | Admitting: Internal Medicine

## 2024-05-10 DIAGNOSIS — B20 Human immunodeficiency virus [HIV] disease: Secondary | ICD-10-CM

## 2024-05-29 ENCOUNTER — Ambulatory Visit: Admitting: Internal Medicine

## 2024-05-29 ENCOUNTER — Ambulatory Visit: Payer: Self-pay

## 2024-06-11 ENCOUNTER — Ambulatory Visit (INDEPENDENT_AMBULATORY_CARE_PROVIDER_SITE_OTHER): Admitting: Infectious Diseases

## 2024-06-11 ENCOUNTER — Other Ambulatory Visit: Payer: Self-pay

## 2024-06-11 ENCOUNTER — Ambulatory Visit

## 2024-06-11 ENCOUNTER — Encounter: Payer: Self-pay | Admitting: Infectious Diseases

## 2024-06-11 ENCOUNTER — Other Ambulatory Visit: Payer: Self-pay | Admitting: Pharmacist

## 2024-06-11 ENCOUNTER — Other Ambulatory Visit (HOSPITAL_COMMUNITY): Payer: Self-pay

## 2024-06-11 ENCOUNTER — Telehealth: Payer: Self-pay

## 2024-06-11 VITALS — BP 125/85 | HR 88 | Temp 97.5°F | Ht 65.5 in | Wt 262.0 lb

## 2024-06-11 DIAGNOSIS — Z79899 Other long term (current) drug therapy: Secondary | ICD-10-CM | POA: Diagnosis not present

## 2024-06-11 DIAGNOSIS — Z0000 Encounter for general adult medical examination without abnormal findings: Secondary | ICD-10-CM | POA: Insufficient documentation

## 2024-06-11 DIAGNOSIS — Z113 Encounter for screening for infections with a predominantly sexual mode of transmission: Secondary | ICD-10-CM | POA: Diagnosis not present

## 2024-06-11 DIAGNOSIS — B20 Human immunodeficiency virus [HIV] disease: Secondary | ICD-10-CM | POA: Diagnosis not present

## 2024-06-11 MED ORDER — BIKTARVY 50-200-25 MG PO TABS
1.0000 | ORAL_TABLET | Freq: Every day | ORAL | 11 refills | Status: AC
  Filled 2024-06-11: qty 30, 30d supply, fill #0
  Filled 2024-07-08: qty 30, 30d supply, fill #1
  Filled 2024-08-08: qty 30, 30d supply, fill #2

## 2024-06-11 NOTE — Progress Notes (Signed)
 [address], [place], [place], [address]                                                                  Phn. [phone]; Fax: [phone]                                                                             Date 06/11/24   Reason for Visit: Routine HIV care.   HPI: Kristy White is a 48 y.o.old female with a history of anemia, anxiety, endometrial cancer s/o TH with BSO, morbid obesity, HIV who is here for regular fu. Patient was previously followed by Dr Efrain for a long time. Last seen 08/21/23.   Interval hx/current visit: Reports compliance with Biktarvy  without missed doses or concerns. She recently had a surgery for endometrial ca in July and is on a follow-up schedule every six months for the next two to three years. Reports her blood work shows low iron level. She is in the process of finding a PCP closer to her residence.  She has not been sexually active for approximately 14 years. She does not smoke, consume alcohol, or use recreational drugs. 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  She completed a mammogram earlier this year but has not had a colon cancer screening. She lacks dental insurance and has not seen a dentist, though she is concerned about her dental health.  No other complaints.   ROS: As stated in above HPI; all other systems were reviewed and are otherwise negative unless noted below  No reported fever / chills, night sweats, unintentional weight loss, acute visual change, odynophagia, chest pain/pressure, new or worsened SOB or WOB, nausea, vomiting, diarrhea, dysuria, GU discharge, syncope, seizures, red/hot swollen joints, hallucinations / delusions, rashes, new allergies,  unusual / excessive bleeding, swollen lymph nodes  PMH/ PSH/ FamHx / Social Hx , medications and allergies reviewed and updated as appropriate; please see corresponding tab in EHR / prior notes                                        Current Outpatient Medications on File Prior to Visit  Medication Sig Dispense Refill   senna-docusate (SENOKOT-S) 8.6-50 MG tablet Take 2 tablets by mouth at bedtime. For AFTER surgery, do not take if having diarrhea (Patient not taking: Reported on 04/03/2024) 30 tablet 0   traMADol  (ULTRAM ) 50 MG tablet Take 1 tablet (50 mg total) by  mouth every 6 (six) hours as needed for severe pain (pain score 7-10). For AFTER surgery only, do not take and drive (Patient not taking: Reported on 04/03/2024) 15 tablet 0   No current facility-administered medications on file prior to visit.    No Known Allergies  Past Medical History:  Diagnosis Date   Abnormal Pap smear of cervix 03/06/2012   Anemia    Anxiety    Endometrial cancer (HCC)    HIV infection (HCC)    Past Surgical History:  Procedure Laterality Date   CESAREAN SECTION     INJECTION, FOR SENTINEL LYMPH NODE IDENTIFICATION Bilateral 04/08/2024   Procedure: SENTINEL LYMPH NODE INJECTION;  Surgeon: Eldonna Mays, MD;  Location: [facility];  Service: Gynecology;  Laterality: Bilateral;   LYMPH NODE BIOPSY Bilateral 04/08/2024   Procedure: SENTINEL LYMPH NODE BIOPSY;  Surgeon: Eldonna Mays, MD;  Location: [facility];  Service: Gynecology;  Laterality: Bilateral;   ROBOTIC ASSISTED TOTAL HYSTERECTOMY WITH BILATERAL SALPINGO OOPHERECTOMY Bilateral 04/08/2024   Procedure: ROBOTIC ASSISTED TOTAL LAPAROSCOPIC HYSTERECTOMY WITH BILATERAL SALPINGO-OOPHORECTOMY;  Surgeon: Eldonna Mays, MD;  Location: [facility];  Service: Gynecology;  Laterality: Bilateral;   Family History  Problem Relation Age of Onset   Mental illness Mother    Breast cancer Neg Hx    Ovarian cancer Neg Hx    Colon cancer Neg Hx    Endometrial cancer  Neg Hx    Vitals BP 125/85   Pulse 88   Temp (!) 97.5 F (36.4 C) (Temporal)   Ht 5' 5.5 (1.664 m)   Wt 262 lb (118.8 kg)   SpO2 97%   BMI 42.94 kg/m   Examination  Gen: no acute distress, morbidly obese  HEENT: [place]/AT, no scleral icterus, no pale conjunctivae, hearing normal, oral mucosa moist Neck: Supple Cardio: Regular rate and rhythm, S1S2 Resp: Pulmonary effort normal in room air, Normal breath sounds  GI: non distended, non tender and soft GU: Musc: Extremities: No pedal edema Skin: No rashes Neuro: grossly non focal , awake, alert and oriented * 3  Psych: Calm, cooperative  Lab Results HIV 1 RNA Quant (Copies/mL)  Date Value  08/21/2023 Not Detected  02/13/2023 Not Detected  08/09/2022 <20 (H)   CD4 T Cell Abs (/uL)  Date Value  08/21/2023 508  02/13/2023 633  08/09/2022 611   No results found for: HIV1GENOSEQ Lab Results  Component Value Date   WBC 10.2 04/07/2024   HGB 9.2 (L) 04/07/2024   HCT 28.8 (L) 04/07/2024   MCV 72.9 (L) 04/07/2024   PLT 493 (H) 04/07/2024    Lab Results  Component Value Date   CREATININE 0.75 04/03/2024   BUN 12 04/03/2024   NA 139 04/03/2024   K 3.5 04/03/2024   CL 108 04/03/2024   CO2 22 04/03/2024   Lab Results  Component Value Date   ALT 14 04/03/2024   AST 13 (L) 04/03/2024   ALKPHOS 65 04/03/2024   BILITOT 0.4 04/03/2024    Lab Results  Component Value Date   CHOL 158 08/09/2022   TRIG 174 (H) 08/09/2022   HDL 49 (L) 08/09/2022   LDLCALC 82 08/09/2022   Lab Results  Component Value Date   HAV NEG 12/07/2011   Lab Results  Component Value Date   HEPBSAG NEGATIVE 12/07/2011   HEPBSAB NEG 12/07/2011   Lab Results  Component Value Date   HCVAB NEGATIVE 12/07/2011   Lab Results  Component Value Date   CHLAMYDIAWP Negative 02/04/2024   N  Negative 02/04/2024   No results found for: GCPROBEAPT No results found for: QUANTGOLD   Health Maintenance: Immunization History  Administered  Date(s) Administered   Hepatitis B, ADULT 08/12/2015   Influenza Split 11/09/2011   Influenza,inj,Quad PF,6+ Mos 08/12/2015, 06/19/2017, 08/18/2020, 09/09/2021, 08/09/2022   Meningococcal Mcv4o 04/23/2019   PFIZER(Purple Top)SARS-COV-2 Vaccination 12/13/2019, 01/03/2020   Pneumococcal Conjugate-13 04/23/2019   Pneumococcal Polysaccharide-23 11/09/2011, 08/18/2020   Tdap 11/09/2011   Assessment/Plan: # HIV - continue PO biktarvy  - labs today  - fu in 5-6 months   # STD Screening  - no acute concerns  - Urine GC + RPR  # Depression - stable  - not on meds  # Morbid Obesity - will benefit from weight loss measures - fu with PCP if needs medication  # Endometrial ca - on surveillance - fu with GYN   # Immunization  - check Hep B surface ab to see if needs vaccinated   #Health maintenance - Lipid panel - Age based ca screening, mammogram done in 01/2024, colon ca screening discussed  - Dental care discussed  Patient's labs were reviewed as well as his previous records. Patients questions were addressed and answered. Safe sex counseling done.  I spent 36 minutes involved in face-to-face and non-face-to-face activities for this patient on the day of the visit. Professional time spent includes the following activities: Preparing to see the patient (review of tests), Obtaining and reviewing separately obtained history (prior notes from Dr Efrain, ED notes, notes from GYN), Performing a medically appropriate examination and evaluation, Ordering medications/tess, clinical information in the EMR, Independently interpreting results (not separately reported), Communicating results to the patient, Counseling and educating the patient and Care coordination (not separately reported).   Of note, portions of this note may have been created with voice recognition software. While this note has been edited for accuracy, occasional wrong-word or 'sound-a-like' substitutions may have occurred due  to the inherent limitations of voice recognition software.   Electronically signed by:  Annalee Orem, MD Infectious Disease Physician [facility] [address] [place], [place] [address] Phone: [phone]  Fax: [phone]

## 2024-06-11 NOTE — Telephone Encounter (Addendum)
 [facility] Patient Advocate Encounter   Was successful in obtaining a [employer] copay card for BIKTARVY .  This copay card will make the patients copay $0.  I have spoken with the patient.    The billing information is as follows and has been shared with [facility].  RxBin: [id number] PCN: [id number] Member ID: [id number] Group ID: [id number]    Charmaine Sharps, CPhT Specialty Pharmacy Patient [facility] Infectious Disease Phone: [phone] Fax:  [phone]

## 2024-06-11 NOTE — Progress Notes (Signed)
 Specialty Pharmacy Initial Fill Coordination Note  Kristy White is a 48 y.o. female contacted today regarding initial fill of specialty medication(s) Bictegravir-Emtricitab-Tenofov (Biktarvy )   Patient requested Delivery   Delivery date: 06/13/24   Verified address: Patient address [id number]  [place] [address]   Medication will be filled on 06/12/24.   Patient is aware of $2,647.78 copayment.    Will get copay for this patient

## 2024-06-11 NOTE — Progress Notes (Signed)
 Specialty Pharmacy Initiation Note   Kristy White is a 48 y.o. female who will be followed by the specialty pharmacy service for RxSp HIV    Review of administration, indication, effectiveness, safety, potential side effects, storage/disposable, and missed dose instructions occurred today for patient's specialty medication(s) Bictegravir-Emtricitab-Tenofov (Biktarvy )     Patient/Caregiver did not have any additional questions or concerns.   Patient's therapy is appropriate to: Continue    Goals Addressed             This Visit's Progress    Achieve Undetectable HIV Viral Load < 20       Patient is on track. Patient will maintain adherence      Comply with lab assessments       Patient is on track. Patient will maintain adherencea      Maintain optimal adherence to therapy       Patient is on track. Patient will maintain adherence         Kristy White Specialty Pharmacist

## 2024-06-12 LAB — URINE CYTOLOGY ANCILLARY ONLY
Chlamydia: NEGATIVE
Comment: NEGATIVE
Comment: NORMAL
Neisseria Gonorrhea: NEGATIVE

## 2024-06-13 LAB — T-HELPER CELLS (CD4) COUNT (NOT AT ARMC)
CD4 % Helper T Cell: 28 % — ABNORMAL LOW (ref 33–65)
CD4 T Cell Abs: 505 /uL (ref 400–1790)

## 2024-06-15 LAB — RPR: RPR Ser Ql: NONREACTIVE

## 2024-06-15 LAB — LIPID PANEL
Cholesterol: 147 mg/dL (ref <200)
HDL: 49 mg/dL — ABNORMAL LOW (ref >=50)
LDL Cholesterol (Calc): 82 mg/dL
Non-HDL Cholesterol (Calc): 98 mg/dL (ref <130)
Total CHOL/HDL Ratio: 3 (calc) (ref <5.0)
Triglycerides: 84 mg/dL (ref <150)

## 2024-06-15 LAB — HIV RNA, RTPCR W/R GT (RTI, PI,INT)
HIV 1 RNA Quant: <20 {copies}/mL — AB
HIV-1 RNA Quant, Log: <1.3 {Log_copies}/mL — AB

## 2024-06-15 LAB — HEPATITIS B SURFACE ANTIBODY,QUALITATIVE: Hep B S Ab: REACTIVE — AB

## 2024-06-18 ENCOUNTER — Ambulatory Visit

## 2024-06-18 ENCOUNTER — Ambulatory Visit: Admitting: Infectious Disease

## 2024-06-18 ENCOUNTER — Ambulatory Visit: Payer: Self-pay | Admitting: Infectious Diseases

## 2024-07-08 ENCOUNTER — Other Ambulatory Visit: Payer: Self-pay

## 2024-07-08 NOTE — Progress Notes (Signed)
 Specialty Pharmacy Refill Coordination Note  Kristy White is a 48 y.o. female contacted today regarding refills of specialty medication(s) Bictegravir-Emtricitab-Tenofov (Biktarvy )   Patient requested Delivery   Delivery date: 07/14/24   Verified address: Patient address [phone]  [place] [address]   Medication will be filled on 07/11/24.

## 2024-07-10 ENCOUNTER — Other Ambulatory Visit: Payer: Self-pay

## 2024-08-08 ENCOUNTER — Other Ambulatory Visit: Payer: Self-pay

## 2024-08-12 ENCOUNTER — Other Ambulatory Visit (HOSPITAL_COMMUNITY): Payer: Self-pay

## 2024-08-14 ENCOUNTER — Other Ambulatory Visit: Payer: Self-pay

## 2024-08-19 ENCOUNTER — Encounter: Payer: Self-pay | Admitting: Psychiatry

## 2024-10-27 ENCOUNTER — Inpatient Hospital Stay: Admitting: Psychiatry

## 2024-10-27 ENCOUNTER — Telehealth: Payer: Self-pay | Admitting: *Deleted

## 2024-10-27 DIAGNOSIS — C541 Malignant neoplasm of endometrium: Secondary | ICD-10-CM

## 2024-10-27 NOTE — Telephone Encounter (Signed)
 Spoke with patient who called to reschedule her appt. Today with Dr. Eldonna. Patient was given a new appt. For Monday, March 23 rd. At 2 pm. Pt agreed to date and time and had no further concerns.

## 2024-10-27 NOTE — Progress Notes (Unsigned)
 This encounter was created in error - please disregard.

## 2024-12-29 ENCOUNTER — Inpatient Hospital Stay: Admitting: Psychiatry
